# Patient Record
Sex: Male | Born: 1940
Health system: Southern US, Community
[De-identification: ages and names within clinical notes are randomized; demographics above are authoritative.]

## PROBLEM LIST (undated history)

## (undated) DIAGNOSIS — G4730 Sleep apnea, unspecified: Secondary | ICD-10-CM

## (undated) DIAGNOSIS — I10 Essential (primary) hypertension: Secondary | ICD-10-CM

## (undated) DIAGNOSIS — H409 Unspecified glaucoma: Secondary | ICD-10-CM

## (undated) HISTORY — PX: COLONOSCOPY: SHX174

---

## 2003-12-14 ENCOUNTER — Emergency Department (HOSPITAL_COMMUNITY): Admission: AD | Admit: 2003-12-14 | Discharge: 2003-12-14 | Payer: Self-pay | Admitting: Emergency Medicine

## 2007-09-22 ENCOUNTER — Emergency Department (HOSPITAL_COMMUNITY): Admission: EM | Admit: 2007-09-22 | Discharge: 2007-09-22 | Payer: Self-pay | Admitting: Emergency Medicine

## 2012-12-29 ENCOUNTER — Emergency Department (HOSPITAL_COMMUNITY): Payer: Medicare Other

## 2012-12-29 ENCOUNTER — Encounter (HOSPITAL_COMMUNITY): Payer: Self-pay | Admitting: *Deleted

## 2012-12-29 ENCOUNTER — Emergency Department (HOSPITAL_COMMUNITY)
Admission: EM | Admit: 2012-12-29 | Discharge: 2012-12-29 | Disposition: A | Discharge status: Home / Self Care | Payer: Medicare Other | Attending: Emergency Medicine | Admitting: Emergency Medicine

## 2012-12-29 DIAGNOSIS — Z79899 Other long term (current) drug therapy: Secondary | ICD-10-CM | POA: Insufficient documentation

## 2012-12-29 DIAGNOSIS — M6740 Ganglion, unspecified site: Secondary | ICD-10-CM | POA: Insufficient documentation

## 2012-12-29 DIAGNOSIS — I10 Essential (primary) hypertension: Secondary | ICD-10-CM | POA: Insufficient documentation

## 2012-12-29 HISTORY — DX: Essential (primary) hypertension: I10

## 2012-12-29 MED ORDER — TRAMADOL HCL 50 MG PO TABS
50.0000 mg | ORAL_TABLET | Freq: Once | ORAL | Status: AC
  Administered 2012-12-29: 50 mg via ORAL
  Filled 2012-12-29: qty 1

## 2012-12-29 MED ORDER — TRAMADOL HCL 50 MG PO TABS: 50.0000 mg | ORAL_TABLET | Freq: Four times a day (QID) | ORAL | Status: DC | PRN

## 2012-12-29 NOTE — ED Notes (Signed)
Struck rt hand accidentally , has cyst on this hand for years.  Now rt hand is swollen and red.

## 2012-12-29 NOTE — ED Provider Notes (Signed)
History     CSN: [id number]  Arrival date & time 12/29/12  1644   First MD Initiated Contact with Patient 12/29/12 2028      Chief Complaint  Patient presents with  . Hand Pain    (Consider location/radiation/quality/duration/timing/severity/associated sxs/prior treatment) HPI Comments: Tracy Davenport is a 72 y.o. Male with right hand pain, for 4 days, that began, after he lifted a heavy object. This maneuver, caused his  right hand cyst to burst. Since then, he has had pain and swelling of the dorsum of his right hand. He did not experience a contusion. He has pain only with palpation. Not with movement. He denies neck, shoulder, elbow or wrist pain. There's been no fever, chills, nausea, or vomiting. There are no other modifying factors.  Patient is a 72 y.o. male presenting with hand pain. The history is provided by the patient.  Hand Pain    Past Medical History  Diagnosis Date  . Hypertension     History reviewed. No pertinent past surgical history.  History reviewed. No pertinent family history.  History  Substance Use Topics  . Smoking status: Never Smoker   . Smokeless tobacco: Not on file  . Alcohol Use: No      Review of Systems  All other systems reviewed and are negative.    Allergies  Review of patient's allergies indicates no known allergies.  Home Medications   Current Outpatient Rx  Name  Route  Sig  Dispense  Refill  . aspirin 325 MG tablet   Oral   Take 325 mg by mouth daily as needed for pain (TAKES ONLY FOR OCCASIONAL PAIN).         Marland Kitchen bimatoprost (LUMIGAN) 0.01 % SOLN   Ophthalmic   Apply 1 drop to eye at bedtime.         Marland Kitchen doxazosin (CARDURA) 8 MG tablet   Oral   Take 8 mg by mouth at bedtime.         . traMADol (ULTRAM) 50 MG tablet   Oral   Take 1 tablet (50 mg total) by mouth every 6 (six) hours as needed for pain.   30 tablet   0     BP 160/98  Pulse 58  Temp(Src) 97.6 F (36.4 C) (Oral)  Resp 18  SpO2  99%  Physical Exam  Nursing note and vitals reviewed. Constitutional: He is oriented to person, place, and time. He appears well-developed and well-nourished.  HENT:  Head: Normocephalic and atraumatic.  Right Ear: External ear normal.  Left Ear: External ear normal.  Eyes: Conjunctivae and EOM are normal. Pupils are equal, round, and reactive to light.  Neck: Normal range of motion and phonation normal. Neck supple.  Cardiovascular: Normal rate.   Pulmonary/Chest: Effort normal. He exhibits no bony tenderness.  Abdominal: Soft. Normal appearance.  Musculoskeletal: Normal range of motion.  Dorsum of right hand is tender and swollen. There is no associated erythema or fluctuance. There is a small ganglion cyst palpable at the dorsal ace of the second metacarpal. There is no proximal streaking or palpable epitrochlear nodes, at the elbow  Neurological: He is alert and oriented to person, place, and time. He has normal strength. No cranial nerve deficit or sensory deficit. He exhibits normal muscle tone. Coordination normal.  Skin: Skin is warm, dry and intact.  Psychiatric: He has a normal mood and affect. His behavior is normal. Judgment and thought content normal.    ED Course  Procedures (including  critical care time)   Medications  traMADol (ULTRAM) tablet 50 mg (50 mg Oral Given 12/29/12 2058)     Ace wrap applied to right hand      Labs Reviewed - No data to display Dg Hand Complete Right  12/29/2012  *RADIOLOGY REPORT*  Clinical Data: Hand pain swelling, redness  RIGHT HAND - COMPLETE 3+ VIEW  Comparison: None.  Findings: Three views of the right hand submitted.  Soft tissue swelling noted dorsal metacarpal region.  Degenerative changes are noted carpal metacarpal joint.  Degenerative changes interphalangeal joint of the thumb.  Degenerative changes distal interphalangeal joints second third and fifth finger.  IMPRESSION: No acute fracture or subluxation.  Soft tissue swelling  dorsally. Degenerative changes as described above.   Original Report Authenticated By: Natasha Mead, M.D.    Nursing Notes Reviewed/ Care Coordinated, and agree without changes. Applicable Imaging Reviewed Interpretation of Laboratory Data incorporated into ED treatment   1. Ganglion cyst       MDM  The patient has had clinical evidence for rupture of a long-standing ganglion cyst of his right hand. I doubt fracture, infection, tendinitis. He is stable for discharge    Plan: Home Medications- Tramadol; Home Treatments- Ace wrap; Recommended follow up- PCP prn       Flint Melter, MD 12/31/12 [phone]

## 2014-07-16 ENCOUNTER — Emergency Department (HOSPITAL_COMMUNITY): Payer: Medicare HMO

## 2014-07-16 ENCOUNTER — Emergency Department (HOSPITAL_COMMUNITY)
Admission: EM | Admit: 2014-07-16 | Discharge: 2014-07-16 | Disposition: A | Discharge status: Home / Self Care | Payer: Medicare HMO | Attending: Emergency Medicine | Admitting: Emergency Medicine

## 2014-07-16 ENCOUNTER — Encounter (HOSPITAL_COMMUNITY): Payer: Self-pay | Admitting: Emergency Medicine

## 2014-07-16 DIAGNOSIS — N200 Calculus of kidney: Secondary | ICD-10-CM | POA: Diagnosis not present

## 2014-07-16 DIAGNOSIS — Z79899 Other long term (current) drug therapy: Secondary | ICD-10-CM | POA: Insufficient documentation

## 2014-07-16 DIAGNOSIS — R112 Nausea with vomiting, unspecified: Secondary | ICD-10-CM | POA: Insufficient documentation

## 2014-07-16 DIAGNOSIS — Z7982 Long term (current) use of aspirin: Secondary | ICD-10-CM | POA: Diagnosis not present

## 2014-07-16 DIAGNOSIS — R109 Unspecified abdominal pain: Secondary | ICD-10-CM

## 2014-07-16 DIAGNOSIS — I10 Essential (primary) hypertension: Secondary | ICD-10-CM | POA: Diagnosis not present

## 2014-07-16 LAB — URINE MICROSCOPIC-ADD ON

## 2014-07-16 LAB — CBC WITH DIFFERENTIAL/PLATELET
BASOS ABS: 0 10*3/uL (ref 0.0–0.1)
Basophils Relative: 0 % (ref 0–1)
EOS PCT: 3 % (ref 0–5)
Eosinophils Absolute: 0.1 10*3/uL (ref 0.0–0.7)
HCT: 38.4 % — ABNORMAL LOW (ref 39.0–52.0)
HEMOGLOBIN: 13.3 g/dL (ref 13.0–17.0)
LYMPHS ABS: 1.7 10*3/uL (ref 0.7–4.0)
Lymphocytes Relative: 37 % (ref 12–46)
MCH: 29.8 pg (ref 26.0–34.0)
MCHC: 34.6 g/dL (ref 30.0–36.0)
MCV: 86.1 fL (ref 78.0–100.0)
MONOS PCT: 7 % (ref 3–12)
Monocytes Absolute: 0.3 10*3/uL (ref 0.1–1.0)
NEUTROS ABS: 2.4 10*3/uL (ref 1.7–7.7)
Neutrophils Relative %: 53 % (ref 43–77)
Platelets: 170 10*3/uL (ref 150–400)
RBC: 4.46 MIL/uL (ref 4.22–5.81)
RDW: 12.2 % (ref 11.5–15.5)
WBC: 4.6 10*3/uL (ref 4.0–10.5)

## 2014-07-16 LAB — BASIC METABOLIC PANEL
ANION GAP: 10 (ref 5–15)
BUN: 18 mg/dL (ref 6–23)
CO2: 28 mEq/L (ref 19–32)
Calcium: 9.6 mg/dL (ref 8.4–10.5)
Chloride: 104 mEq/L (ref 96–112)
Creatinine, Ser: 1.14 mg/dL (ref 0.50–1.35)
GFR calc non Af Amer: 62 mL/min — ABNORMAL LOW (ref >90)
GFR, EST AFRICAN AMERICAN: 72 mL/min — AB (ref >90)
GLUCOSE: 99 mg/dL (ref 70–99)
Potassium: 3.7 mEq/L (ref 3.7–5.3)
Sodium: 142 mEq/L (ref 137–147)

## 2014-07-16 LAB — URINALYSIS, ROUTINE W REFLEX MICROSCOPIC
BILIRUBIN URINE: NEGATIVE
GLUCOSE, UA: NEGATIVE mg/dL
Ketones, ur: NEGATIVE mg/dL
LEUKOCYTES UA: NEGATIVE
NITRITE: NEGATIVE
PH: 7.5 (ref 5.0–8.0)
PROTEIN: NEGATIVE mg/dL
Specific Gravity, Urine: 1.01 (ref 1.005–1.030)
Urobilinogen, UA: 1 mg/dL (ref 0.0–1.0)

## 2014-07-16 MED ORDER — ONDANSETRON HCL 4 MG PO TABS: 4.0000 mg | ORAL_TABLET | Freq: Three times a day (TID) | ORAL | Status: DC | PRN

## 2014-07-16 MED ORDER — OXYCODONE-ACETAMINOPHEN 5-325 MG PO TABS: 1.0000 | ORAL_TABLET | Freq: Four times a day (QID) | ORAL | Status: DC | PRN

## 2014-07-16 MED ORDER — ONDANSETRON HCL 4 MG/2ML IJ SOLN
4.0000 mg | Freq: Once | INTRAMUSCULAR | Status: AC
  Administered 2014-07-16: 4 mg via INTRAMUSCULAR
  Filled 2014-07-16: qty 2

## 2014-07-16 MED ORDER — SODIUM CHLORIDE 0.9 % IV BOLUS (SEPSIS)
1000.0000 mL | Freq: Once | INTRAVENOUS | Status: AC
  Administered 2014-07-16: 1000 mL via INTRAVENOUS

## 2014-07-16 MED ORDER — MORPHINE SULFATE 4 MG/ML IJ SOLN
4.0000 mg | Freq: Once | INTRAMUSCULAR | Status: AC
  Administered 2014-07-16: 4 mg via INTRAVENOUS
  Filled 2014-07-16: qty 1

## 2014-07-16 MED ORDER — TAMSULOSIN HCL 0.4 MG PO CAPS
0.4000 mg | ORAL_CAPSULE | Freq: Every day | ORAL | Status: DC
Start: 2014-07-16 — End: 2015-10-16

## 2014-07-16 NOTE — ED Notes (Signed)
Pt reports pain and pressure to right flank area that started about an hour ago.

## 2014-07-16 NOTE — ED Provider Notes (Signed)
CSN: [id number]     Arrival date & time 07/16/14  0057 History   First MD Initiated Contact with Patient 07/16/14 0126     Chief Complaint  Patient presents with  . Flank Pain     (Consider location/radiation/quality/duration/timing/severity/associated sxs/prior Treatment) HPI  This is a 73 year old male with history of hypertension who presents with right-sided flank pain. Patient reports onset of symptoms at 10:30 PM. He reports pressure-like pain that is nonradiating. He reports associated vomiting and nausea without diarrhea. Current pain is 7/10. Denies any history of similar symptoms. Denies any hematuria or dysuria. No known history of kidney stones. Patient denies any fevers, chest pain, shortness of breath, abdominal pain.  Past Medical History  Diagnosis Date  . Hypertension    History reviewed. No pertinent past surgical history. No family history on file. History  Substance Use Topics  . Smoking status: Never Smoker   . Smokeless tobacco: Not on file  . Alcohol Use: No    Review of Systems  Constitutional: Negative.  Negative for fever.  Respiratory: Negative.  Negative for chest tightness and shortness of breath.   Cardiovascular: Negative.  Negative for chest pain.  Gastrointestinal: Positive for nausea and vomiting. Negative for abdominal pain and diarrhea.  Genitourinary: Positive for flank pain. Negative for dysuria and hematuria.  Musculoskeletal: Negative for gait problem.  Skin: Negative for rash.  Neurological: Negative for headaches.  All other systems reviewed and are negative.     Allergies  Review of patient's allergies indicates no known allergies.  Home Medications   Prior to Admission medications   Medication Sig Start Date End Date Taking? Authorizing Provider  aspirin 325 MG tablet Take 325 mg by mouth daily as needed for pain (TAKES ONLY FOR OCCASIONAL PAIN).   Yes Historical Provider, MD  bimatoprost (LUMIGAN) 0.01 % SOLN Apply 1 drop  to eye at bedtime.   Yes Historical Provider, MD  doxazosin (CARDURA) 8 MG tablet Take 8 mg by mouth at bedtime.   Yes Historical Provider, MD  traMADol (ULTRAM) 50 MG tablet Take 1 tablet (50 mg total) by mouth every 6 (six) hours as needed for pain. 12/29/12  Yes Richarda Blade, MD  ondansetron (ZOFRAN) 4 MG tablet Take 1 tablet (4 mg total) by mouth every 8 (eight) hours as needed for nausea or vomiting. 07/16/14   Merryl Hacker, MD  oxyCODONE-acetaminophen (PERCOCET/ROXICET) 5-325 MG per tablet Take 1 tablet by mouth every 6 (six) hours as needed for moderate pain or severe pain. 07/16/14   Merryl Hacker, MD  tamsulosin (FLOMAX) 0.4 MG CAPS capsule Take 1 capsule (0.4 mg total) by mouth daily. 07/16/14   Merryl Hacker, MD   BP 193/95  Pulse 62  Temp(Src) 98.1 F (36.7 C) (Oral)  Resp 20  Ht 5\' 10"  (1.778 m)  Wt 181 lb (82.101 kg)  BMI 25.97 kg/m2  SpO2 99% Physical Exam  Nursing note and vitals reviewed. Constitutional: He is oriented to person, place, and time. No distress.  Elderly, uncomfortable appearing  HENT:  Head: Normocephalic and atraumatic.  Mouth/Throat: Oropharynx is clear and moist.  Cardiovascular: Normal rate, regular rhythm and normal heart sounds.   No murmur heard. Pulmonary/Chest: Effort normal and breath sounds normal. No respiratory distress. He has no wheezes.  Abdominal: Soft. Bowel sounds are normal. There is no tenderness. There is no rebound and no guarding.  Genitourinary:  No CVA tenderness  Musculoskeletal: He exhibits no edema.  Lymphadenopathy:    He  has no cervical adenopathy.  Neurological: He is alert and oriented to person, place, and time.  Skin: Skin is warm and dry.  Psychiatric: He has a normal mood and affect.    ED Course  Procedures (including critical care time) Labs Review Labs Reviewed  CBC WITH DIFFERENTIAL - Abnormal; Notable for the following:    HCT 38.4 (*)    All other components within normal limits   BASIC METABOLIC PANEL - Abnormal; Notable for the following:    GFR calc non Af Amer 62 (*)    GFR calc Af Amer 72 (*)    All other components within normal limits  URINALYSIS, ROUTINE W REFLEX MICROSCOPIC - Abnormal; Notable for the following:    APPearance HAZY (*)    Hgb urine dipstick LARGE (*)    All other components within normal limits  URINE MICROSCOPIC-ADD ON    Imaging Review Ct Renal Stone Study  07/16/2014   CLINICAL DATA:  Constant pain and pressure right flank x2 hr. Initial evaluation.  EXAM: CT ABDOMEN AND PELVIS WITHOUT CONTRAST  TECHNIQUE: Multidetector CT imaging of the abdomen and pelvis was performed following the standard protocol without IV contrast.  COMPARISON:  No prior.  FINDINGS: Liver normal. Spleen normal. Pancreas normal. No biliary distention. Gallbladder nondistended.  Adrenals are normal. Simple cyst left kidney. 4 mm stone noted at the distal right ureter with mild right hydronephrosis and hydroureter. Right periureteral streaking is present. Bladder is nondistended. The prostate is severely enlarged measuring 6.7 x 5.6 x 8.4 cm. Prostate contour is irregular. Seminal vesicles are mildly prominent. BPH and/or prostate malignancy could present in this fashion.  No significant inguinal adenopathy is noted. No significant retroperitoneal lymph nodes are noted. Aorta normal caliber.  Appendix normal. No bowel distention. No free air. No mesenteric mass. No significant abdominal wall hernia.  Heart size normal. Mild basilar pleural thickening, possibly scarring noted. Multiple sclerotic densities are noted throughout lumbar spine and the pelvis. Blastic metastatic disease could present in this fashion, possibly from prostate cancer. Whole-body bone scan suggested to further evaluate.  IMPRESSION: 1. 4 mm stone distal right ureter with mild right hydronephrosis and hydroureter. 2. Severely enlarged irregular prostate. BPH and/or prostate malignancy could present in this  fashion. 3. Multiple sclerotic densities noted throughout the lumbar spine and pelvis suggesting the possibility of metastatic disease. Whole-body bone scan suggested to further evaluate.   Electronically Signed   By: Marcello Moores  Register   On: 07/16/2014 02:31     EKG Interpretation None      MDM   Final diagnoses:  Flank pain  Kidney stone    Patient presents with onset of left flank pain.  It is uncomfortable appearing but nontoxic on exam. Pain is not reproducible on exam. History suggestive of possible kidney stones. Patient given fluids, pain medication, and nausea medication.  Basic labwork obtained. Lab work notable for too numerous to count red cells in the urine. CT stone study with 4 mm distal right ureter stone with mild hydronephrosis. Incidentally, patient to have a severely enlarged prostate and multiple sclerotic densities concerning for possible malignancy. On recheck, patient reports improvement of his symptoms his pain medication. Discussed with patient and his wife supportive care and expectant management for his kidney stones. Patient does report a history of enlarged prostate. I discussed with him the results of the CT scan. He has a primary care appointment on Friday. He will be given his CT results and further evaluation will be deferred  to his primary care physician.  Patient will also be referred to urology. He was placed on Flomax and we given pain medication. He was given strict return precautions.  After history, exam, and medical workup I feel the patient has been appropriately medically screened and is safe for discharge home. Pertinent diagnoses were discussed with the patient. Patient was given return precautions.     Merryl Hacker, MD 07/16/14 [phone]

## 2014-07-16 NOTE — Discharge Instructions (Signed)
You were seen today for flank pain and found to have kidney stones. Your CT scan also shows an enlarged prostate which may need further evaluation for possible metastatic disease. Regarding kidney stones, you should aggressively hydrate.   Your stone is 4 mm and will likely pass on its own. If you continue to have further pain, UA given followup with urology. He would be given pain and nausea medication.   Kidney Stones Kidney stones (urolithiasis) are deposits that form inside your kidneys. The intense pain is caused by the stone moving through the urinary tract. When the stone moves, the ureter goes into spasm around the stone. The stone is usually passed in the urine.  CAUSES   A disorder that makes certain neck glands produce too much parathyroid hormone (primary hyperparathyroidism).  A buildup of uric acid crystals, similar to gout in your joints.  Narrowing (stricture) of the ureter.  A kidney obstruction present at birth (congenital obstruction).  Previous surgery on the kidney or ureters.  Numerous kidney infections. SYMPTOMS   Feeling sick to your stomach (nauseous).  Throwing up (vomiting).  Blood in the urine (hematuria).  Pain that usually spreads (radiates) to the groin.  Frequency or urgency of urination. DIAGNOSIS   Taking a history and physical exam.  Blood or urine tests.  CT scan.  Occasionally, an examination of the inside of the urinary bladder (cystoscopy) is performed. TREATMENT   Observation.  Increasing your fluid intake.  Extracorporeal shock wave lithotripsy--This is a noninvasive procedure that uses shock waves to break up kidney stones.  Surgery may be needed if you have severe pain or persistent obstruction. There are various surgical procedures. Most of the procedures are performed with the use of small instruments. Only small incisions are needed to accommodate these instruments, so recovery time is minimized. The size, location, and  chemical composition are all important variables that will determine the proper choice of action for you. Talk to your health care provider to better understand your situation so that you will minimize the risk of injury to yourself and your kidney.  HOME CARE INSTRUCTIONS   Drink enough water and fluids to keep your urine clear or pale yellow. This will help you to pass the stone or stone fragments.  Strain all urine through the provided strainer. Keep all particulate matter and stones for your health care provider to see. The stone causing the pain may be as small as a grain of salt. It is very important to use the strainer each and every time you pass your urine. The collection of your stone will allow your health care provider to analyze it and verify that a stone has actually passed. The stone analysis will often identify what you can do to reduce the incidence of recurrences.  Only take over-the-counter or prescription medicines for pain, discomfort, or fever as directed by your health care provider.  Make a follow-up appointment with your health care provider as directed.  Get follow-up X-rays if required. The absence of pain does not always mean that the stone has passed. It may have only stopped moving. If the urine remains completely obstructed, it can cause loss of kidney function or even complete destruction of the kidney. It is your responsibility to make sure X-rays and follow-ups are completed. Ultrasounds of the kidney can show blockages and the status of the kidney. Ultrasounds are not associated with any radiation and can be performed easily in a matter of minutes. SEEK MEDICAL CARE IF:  You experience pain that is progressive and unresponsive to any pain medicine you have been prescribed. SEEK IMMEDIATE MEDICAL CARE IF:   Pain cannot be controlled with the prescribed medicine.  You have a fever or shaking chills.  The severity or intensity of pain increases over 18 hours and  is not relieved by pain medicine.  You develop a new onset of abdominal pain.  You feel faint or pass out.  You are unable to urinate. MAKE SURE YOU:   Understand these instructions.  Will watch your condition.  Will get help right away if you are not doing well or get worse. Document Released: 09/23/2005 Document Revised: 05/26/2013 Document Reviewed: 02/24/2013 [facility] Patient Information 2015 [place], [place]. This information is not intended to replace advice given to you by your health care provider. Make sure you discuss any questions you have with your health care provider.

## 2014-11-17 DIAGNOSIS — H26493 Other secondary cataract, bilateral: Secondary | ICD-10-CM | POA: Diagnosis not present

## 2014-11-17 DIAGNOSIS — H4011X3 Primary open-angle glaucoma, severe stage: Secondary | ICD-10-CM | POA: Diagnosis not present

## 2014-12-15 DIAGNOSIS — I10 Essential (primary) hypertension: Secondary | ICD-10-CM | POA: Diagnosis not present

## 2014-12-15 DIAGNOSIS — N529 Male erectile dysfunction, unspecified: Secondary | ICD-10-CM | POA: Diagnosis not present

## 2014-12-16 DIAGNOSIS — I10 Essential (primary) hypertension: Secondary | ICD-10-CM | POA: Diagnosis not present

## 2014-12-16 DIAGNOSIS — H4011X2 Primary open-angle glaucoma, moderate stage: Secondary | ICD-10-CM | POA: Diagnosis not present

## 2014-12-16 DIAGNOSIS — Z961 Presence of intraocular lens: Secondary | ICD-10-CM | POA: Diagnosis not present

## 2015-01-27 DIAGNOSIS — I10 Essential (primary) hypertension: Secondary | ICD-10-CM | POA: Diagnosis not present

## 2015-03-13 DIAGNOSIS — I10 Essential (primary) hypertension: Secondary | ICD-10-CM | POA: Diagnosis not present

## 2015-03-28 DIAGNOSIS — H26493 Other secondary cataract, bilateral: Secondary | ICD-10-CM | POA: Diagnosis not present

## 2015-03-28 DIAGNOSIS — H4011X2 Primary open-angle glaucoma, moderate stage: Secondary | ICD-10-CM | POA: Diagnosis not present

## 2015-06-20 DIAGNOSIS — Z23 Encounter for immunization: Secondary | ICD-10-CM | POA: Diagnosis not present

## 2015-06-20 DIAGNOSIS — H409 Unspecified glaucoma: Secondary | ICD-10-CM | POA: Diagnosis not present

## 2015-06-20 DIAGNOSIS — I10 Essential (primary) hypertension: Secondary | ICD-10-CM | POA: Diagnosis not present

## 2015-08-01 DIAGNOSIS — H26493 Other secondary cataract, bilateral: Secondary | ICD-10-CM | POA: Diagnosis not present

## 2015-08-01 DIAGNOSIS — H401132 Primary open-angle glaucoma, bilateral, moderate stage: Secondary | ICD-10-CM | POA: Diagnosis not present

## 2015-09-19 DIAGNOSIS — R739 Hyperglycemia, unspecified: Secondary | ICD-10-CM | POA: Diagnosis not present

## 2015-09-19 DIAGNOSIS — I10 Essential (primary) hypertension: Secondary | ICD-10-CM | POA: Diagnosis not present

## 2015-09-19 DIAGNOSIS — Z23 Encounter for immunization: Secondary | ICD-10-CM | POA: Diagnosis not present

## 2015-09-20 ENCOUNTER — Telehealth: Payer: Self-pay

## 2015-09-20 NOTE — Telephone Encounter (Signed)
[phone]   PATIENT RECEIVED LETTER TO SCHEDULE TCS

## 2015-09-21 ENCOUNTER — Telehealth: Payer: Self-pay

## 2015-09-21 NOTE — Telephone Encounter (Signed)
Gastroenterology Pre-Procedure Review  Request Date: 09/21/2015 Requesting Physician: Dr. Legrand Rams  PATIENT REVIEW QUESTIONS: The patient responded to the following health history questions as indicated:    Pt said he had his first colonoscopy about 9-10 years ago at [facility]  1. Diabetes Melitis: no 2. Joint replacements in the past 12 months: no 3. Major health problems in the past 3 months: no 4. Has an artificial valve or MVP: no 5. Has a defibrillator: no 6. Has been advised in past to take antibiotics in advance of a procedure like teeth cleaning: no 7. Family history of colon cancer: no  8. Alcohol Use: Occasionally     He will drink a can of beer every day for week or so and then go months without any 9. History of sleep apnea: no     MEDICATIONS & ALLERGIES:    Patient reports the following regarding taking any blood thinners:   Plavix? no Aspirin? no Coumadin? no  Patient confirms/reports the following medications:  Current Outpatient Prescriptions  Medication Sig Dispense Refill  . aspirin 325 MG tablet Take 325 mg by mouth daily as needed for pain (TAKES ONLY FOR OCCASIONAL PAIN).    Marland Kitchen bimatoprost (LUMIGAN) 0.01 % SOLN Apply 1 drop to eye at bedtime.    Marland Kitchen doxazosin (CARDURA) 8 MG tablet Take 8 mg by mouth at bedtime.    Marland Kitchen losartan (COZAAR) 100 MG tablet Take 100 mg by mouth daily.    . ondansetron (ZOFRAN) 4 MG tablet Take 1 tablet (4 mg total) by mouth every 8 (eight) hours as needed for nausea or vomiting. 15 tablet 0  . oxyCODONE-acetaminophen (PERCOCET/ROXICET) 5-325 MG per tablet Take 1 tablet by mouth every 6 (six) hours as needed for moderate pain or severe pain. (Patient not taking: Reported on 09/21/2015) 15 tablet 0  . tamsulosin (FLOMAX) 0.4 MG CAPS capsule Take 1 capsule (0.4 mg total) by mouth daily. (Patient not taking: Reported on 09/21/2015) 15 capsule 0  . traMADol (ULTRAM) 50 MG tablet Take 1 tablet (50 mg total) by mouth every 6 (six) hours as needed for  pain. (Patient not taking: Reported on 09/21/2015) 30 tablet 0   No current facility-administered medications for this visit.    Patient confirms/reports the following allergies:  No Known Allergies  No orders of the defined types were placed in this encounter.    AUTHORIZATION INFORMATION Primary Insurance:   ID #:   Group #:  Pre-Cert / Auth #:   Secondary Insurance:   ID #:   Group #:  Pre-Cert / Auth required:  Pre-Cert / Auth #:   SCHEDULE INFORMATION: Procedure has been scheduled as follows:  Date:                Time:   Location:   This Gastroenterology Pre-Precedure Review Form is being routed to the following provider(s): Barney Drain, MD

## 2015-09-21 NOTE — Telephone Encounter (Signed)
LM for pt to call

## 2015-09-21 NOTE — Telephone Encounter (Signed)
See separate triage.  

## 2015-09-25 NOTE — Telephone Encounter (Signed)
SUPREP SPLIT DOSING- CLEAR LIQUIDS WITH BREAKFAST.  

## 2015-09-26 ENCOUNTER — Other Ambulatory Visit: Payer: Self-pay

## 2015-09-26 DIAGNOSIS — Z1211 Encounter for screening for malignant neoplasm of colon: Secondary | ICD-10-CM

## 2015-09-26 MED ORDER — NA SULFATE-K SULFATE-MG SULF 17.5-3.13-1.6 GM/177ML PO SOLN: 1.0000 | ORAL | Status: DC

## 2015-09-26 NOTE — Telephone Encounter (Signed)
Rx sent to the pharmacy and instructions mailed to pt.  

## 2015-10-03 ENCOUNTER — Telehealth: Payer: Self-pay

## 2015-10-03 NOTE — Telephone Encounter (Signed)
I went online with [facility] and the colonoscopy was approved , Ref # [id number].

## 2015-10-16 ENCOUNTER — Ambulatory Visit (HOSPITAL_COMMUNITY)
Admission: RE | Admit: 2015-10-16 | Discharge: 2015-10-16 | Disposition: A | Discharge status: Home / Self Care | Payer: Medicare HMO | Source: Ambulatory Visit | Attending: Gastroenterology | Admitting: Gastroenterology

## 2015-10-16 ENCOUNTER — Encounter (HOSPITAL_COMMUNITY): Payer: Self-pay | Admitting: *Deleted

## 2015-10-16 ENCOUNTER — Encounter (HOSPITAL_COMMUNITY)
Admission: RE | Disposition: A | Discharge status: Home / Self Care | Payer: Self-pay | Source: Ambulatory Visit | Attending: Gastroenterology

## 2015-10-16 DIAGNOSIS — K648 Other hemorrhoids: Secondary | ICD-10-CM | POA: Diagnosis not present

## 2015-10-16 DIAGNOSIS — Z1211 Encounter for screening for malignant neoplasm of colon: Secondary | ICD-10-CM | POA: Insufficient documentation

## 2015-10-16 DIAGNOSIS — D122 Benign neoplasm of ascending colon: Secondary | ICD-10-CM | POA: Diagnosis not present

## 2015-10-16 DIAGNOSIS — Z79899 Other long term (current) drug therapy: Secondary | ICD-10-CM | POA: Diagnosis not present

## 2015-10-16 DIAGNOSIS — Q438 Other specified congenital malformations of intestine: Secondary | ICD-10-CM | POA: Diagnosis not present

## 2015-10-16 DIAGNOSIS — D123 Benign neoplasm of transverse colon: Secondary | ICD-10-CM

## 2015-10-16 DIAGNOSIS — K5730 Diverticulosis of large intestine without perforation or abscess without bleeding: Secondary | ICD-10-CM | POA: Diagnosis not present

## 2015-10-16 DIAGNOSIS — Z7982 Long term (current) use of aspirin: Secondary | ICD-10-CM | POA: Diagnosis not present

## 2015-10-16 DIAGNOSIS — I10 Essential (primary) hypertension: Secondary | ICD-10-CM | POA: Insufficient documentation

## 2015-10-16 HISTORY — PX: COLONOSCOPY: SHX5424

## 2015-10-16 SURGERY — COLONOSCOPY
Anesthesia: Moderate Sedation

## 2015-10-16 MED ORDER — MEPERIDINE HCL 100 MG/ML IJ SOLN
INTRAMUSCULAR | Status: AC
  Filled 2015-10-16: qty 2

## 2015-10-16 MED ORDER — SPOT INK MARKER SYRINGE KIT
PACK | SUBMUCOSAL | Status: DC | PRN
  Administered 2015-10-16: 1 mL via SUBMUCOSAL

## 2015-10-16 MED ORDER — MEPERIDINE HCL 100 MG/ML IJ SOLN
INTRAMUSCULAR | Status: DC | PRN
  Administered 2015-10-16 (×2): 25 mg via INTRAVENOUS

## 2015-10-16 MED ORDER — SODIUM CHLORIDE 0.9 % IV SOLN
INTRAVENOUS | Status: DC
  Administered 2015-10-16: 09:00:00 via INTRAVENOUS

## 2015-10-16 MED ORDER — MIDAZOLAM HCL 5 MG/5ML IJ SOLN
INTRAMUSCULAR | Status: DC | PRN
  Administered 2015-10-16 (×2): 2 mg via INTRAVENOUS
  Administered 2015-10-16: 1 mg via INTRAVENOUS

## 2015-10-16 MED ORDER — STERILE WATER FOR IRRIGATION IR SOLN
Status: DC | PRN
  Administered 2015-10-16: 10:00:00

## 2015-10-16 MED ORDER — MIDAZOLAM HCL 5 MG/5ML IJ SOLN
INTRAMUSCULAR | Status: AC
  Filled 2015-10-16: qty 10

## 2015-10-16 NOTE — Op Note (Signed)
[facility] [address] [place], [address]   COLONOSCOPY PROCEDURE REPORT  PATIENT: Tracy, Davenport  MR#: [id number] BIRTHDATE: December 13, 1940 , 74  yrs. old GENDER: male ENDOSCOPIST: Danie Binder, MD REFERRED [id number] Fanta, M.D. PROCEDURE DATE:  10-27-15 PROCEDURE:   Colonoscopy with cold biopsy AND snare polypectomy, and Submucosal injection(SPOT 1 CC) INDICATIONS:average risk patient for colon cancer. MEDICATIONS: Demerol 50 mg IV and Versed 5 mg IV  DESCRIPTION OF PROCEDURE:    Physical exam was performed.  Informed consent was obtained from the patient after explaining the benefits, risks, and alternatives to procedure.  The patient was connected to monitor and placed in left lateral position. Continuous oxygen was provided by nasal cannula and IV medicine administered through an indwelling cannula.  After administration of sedation and rectal exam, the patients rectum was intubated and the [id number]  colonoscope was advanced under direct visualization to the cecum.  The scope was removed slowly by carefully examining the color, texture, anatomy, and integrity mucosa on the way out.  The patient was recovered in endoscopy and discharged home in satisfactory condition. Estimated blood loss is zero unless otherwise noted in this procedure report.    COLON FINDINGS: Three sessile polyps ranging from 6 to 73mm in size were found in the transverse colon(2)and ascending colon(1.2 CM). A polypectomy was performed using snare cautery.  1 CC SPOT tattoo was applied IN ASCENDING COLON  The AC wound was closed by placing hemoclips(1).Three sessile polyps ranging from 2 to 18mm in size were found at the splenic flexure, hepatic flexure, and in the transverse colon.  A polypectomy was performed with cold forceps.There was mild diverticulosis noted in the descending colon and sigmoid colon with associated tortuosity and muscular hypertrophy. The  colon was redundant.  Manual abdominal counter-pressure was used to reach the cecum, and Small internal hemorrhoids were found.  PREP QUALITY: excellent.  CECAL W/D TIME: 75       minutes COMPLICATIONS: None  ENDOSCOPIC IMPRESSION: 1.   SIX COLON polyps REMOVED 2.  Mild diverticulosis noted in the descending colon and sigmoid colon 4.   The LEFT colon IS was redundant 5.   Small internal hemorrhoids  RECOMMENDATIONS: NO MRI FOR 30 DAYS DUE TO METAL CLIP PLACEMENT IN THE COLON. FOLLOW A HIGH FIBER DIET. AWAIT BIOPSY RESULTS. Next colonoscopy in 1 year DUE TO SIZE OF RIGHT AND LEFT COLON POLYPS.      _______________________________ Lorrin MaisDanie Binder, MD 2015-10-27 2:27 PM    CPT CODES: ICD CODES:  The ICD and CPT codes recommended by this software are interpretations from the data that the clinical staff has captured with the software.  The verification of the translation of this report to the ICD and CPT codes and modifiers is the sole responsibility of the health care institution and practicing physician where this report was generated.  [facility] will not be held responsible for the validity of the ICD and CPT codes included on this report.  AMA assumes no liability for data contained or not contained herein. CPT is a Designer, television/film set of the [employer].  PATIENT NAME:  Tracy, Davenport MR#: [id number]

## 2015-10-16 NOTE — Discharge Instructions (Signed)
You had 6 polyps removed. You have internal hemorrhoids and diverticulosis IN YOUR  LEFT COLON. I PLACED A CLIP IN YOUR RIGHT COLON TO PREVENT BLEEDING IN 7-10 DAYS.    NO MRI FOR 30 DAYS DUE TO METAL CLIP PLACEMENT IN THE COLON.  FOLLOW A HIGH FIBER DIET. AVOID ITEMS THAT CAUSE BLOATING. SEE INFO BELOW.  YOUR BIOPSY RESULTS WILL BE AVAILABLE IN MY CHART JAN 12 AND MY OFFICE WILL CONTACT YOU IN 10-14 DAYS WITH YOUR RESULTS.   Next colonoscopy in 1 year.   During office hours please call [phone] if any problems.  After hours or weekends, please call Main Number at [facility] [phone] if any problems.      Colonoscopy Care After Read the instructions outlined below and refer to this sheet in the next week. These discharge instructions provide you with general information on caring for yourself after you leave the hospital. While your treatment has been planned according to the most current medical practices available, unavoidable complications occasionally occur. If you have any problems or questions after discharge, call DR. Saveon Plant, [phone].  ACTIVITY  You may resume your regular activity, but move at a slower pace for the next 24 hours.   Take frequent rest periods for the next 24 hours.   Walking will help get rid of the air and reduce the bloated feeling in your belly (abdomen).   No driving for 24 hours (because of the medicine (anesthesia) used during the test).   You may shower.   Do not sign any important legal documents or operate any machinery for 24 hours (because of the anesthesia used during the test).    NUTRITION  Drink plenty of fluids.   You may resume your normal diet as instructed by your doctor.   Begin with a light meal and progress to your normal diet. Heavy or fried foods are harder to digest and may make you feel sick to your stomach (nauseated).   Avoid alcoholic beverages for 24 hours or as instructed.    MEDICATIONS  You may  resume your normal medications.   WHAT YOU CAN EXPECT TODAY  Some feelings of bloating in the abdomen.   Passage of more gas than usual.   Spotting of blood in your stool or on the toilet paper  .  IF YOU HAD POLYPS REMOVED DURING THE COLONOSCOPY:  Eat a soft diet IF YOU HAVE NAUSEA, BLOATING, ABDOMINAL PAIN, OR VOMITING.    FINDING OUT THE RESULTS OF YOUR TEST Not all test results are available during your visit. DR. Oneida Alar WILL CALL YOU WITHIN 14 DAYS OF YOUR PROCEDUE WITH YOUR RESULTS. Do not assume everything is normal if you have not heard from DR. Kesley Gaffey, CALL HER OFFICE AT [phone].  SEEK IMMEDIATE MEDICAL ATTENTION AND CALL THE OFFICE: [phone] IF:  You have more than a spotting of blood in your stool.   Your belly is swollen (abdominal distention).   You are nauseated or vomiting.   You have a temperature over 101F.   You have abdominal pain or discomfort that is severe or gets worse throughout the day.  Polyps, Colon  A polyp is extra tissue that grows inside your body. Colon polyps grow in the large intestine. The large intestine, also called the colon, is part of your digestive system. It is a long, hollow tube at the end of your digestive tract where your body makes and stores stool. Most polyps are not dangerous. They are benign. This means  they are not cancerous. But over time, some types of polyps can turn into cancer. Polyps that are smaller than a pea are usually not harmful. But larger polyps could someday become or may already be cancerous. To be safe, doctors remove all polyps and test them.   PREVENTION There is not one sure way to prevent polyps. You might be able to lower your risk of getting them if you:  Eat more fruits and vegetables and less fatty food.   Do not smoke.   Avoid alcohol.   Exercise every day.   Lose weight if you are overweight.   Eating more calcium and folate can also lower your risk of getting polyps. Some foods  that are rich in calcium are milk, cheese, and broccoli. Some foods that are rich in folate are chickpeas, kidney beans, and spinach.   High-Fiber Diet A high-fiber diet changes your normal diet to include more whole grains, legumes, fruits, and vegetables. Changes in the diet involve replacing refined carbohydrates with unrefined foods. The calorie level of the diet is essentially unchanged. The Dietary Reference Intake (recommended amount) for adult males is 38 grams per day. For adult females, it is 25 grams per day. Pregnant and lactating women should consume 28 grams of fiber per day. Fiber is the intact part of a plant that is not broken down during digestion. Functional fiber is fiber that has been isolated from the plant to provide a beneficial effect in the body. PURPOSE  Increase stool bulk.   Ease and regulate bowel movements.   Lower cholesterol.  REDUCE RISK OF COLON CANCER  INDICATIONS THAT YOU NEED MORE FIBER  Constipation and hemorrhoids.   Uncomplicated diverticulosis (intestine condition) and irritable bowel syndrome.   Weight management.   As a protective measure against hardening of the arteries (atherosclerosis), diabetes, and cancer.   GUIDELINES FOR INCREASING FIBER IN THE DIET  Start adding fiber to the diet slowly. A gradual increase of about 5 more grams (2 slices of whole-wheat bread, 2 servings of most fruits or vegetables, or 1 bowl of high-fiber cereal) per day is best. Too rapid an increase in fiber may result in constipation, flatulence, and bloating.   Drink enough water and fluids to keep your urine clear or pale yellow. Water, juice, or caffeine-free drinks are recommended. Not drinking enough fluid may cause constipation.   Eat a variety of high-fiber foods rather than one type of fiber.   Try to increase your intake of fiber through using high-fiber foods rather than fiber pills or supplements that contain small amounts of fiber.   The goal is to  change the types of food eaten. Do not supplement your present diet with high-fiber foods, but replace foods in your present diet.   INCLUDE A VARIETY OF FIBER SOURCES  Replace refined and processed grains with whole grains, canned fruits with fresh fruits, and incorporate other fiber sources. White rice, white breads, and most bakery goods contain little or no fiber.   Brown whole-grain rice, buckwheat oats, and many fruits and vegetables are all good sources of fiber. These include: broccoli, Brussels sprouts, cabbage, cauliflower, beets, sweet potatoes, white potatoes (skin on), carrots, tomatoes, eggplant, squash, berries, fresh fruits, and dried fruits.   Cereals appear to be the richest source of fiber. Cereal fiber is found in whole grains and bran. Bran is the fiber-rich outer coat of cereal grain, which is largely removed in refining. In whole-grain cereals, the bran remains. In breakfast cereals, the  largest amount of fiber is found in those with "bran" in their names. The fiber content is sometimes indicated on the label.   You may need to include additional fruits and vegetables each day.   In baking, for 1 cup white flour, you may use the following substitutions:   1 cup whole-wheat flour minus 2 tablespoons.   1/2 cup white flour plus 1/2 cup whole-wheat flour.   Diverticulosis Diverticulosis is a common condition that develops when small pouches (diverticula) form in the wall of the colon. The risk of diverticulosis increases with age. It happens more often in people who eat a low-fiber diet. Most individuals with diverticulosis have no symptoms. Those individuals with symptoms usually experience belly (abdominal) pain, constipation, or loose stools (diarrhea).  HOME CARE INSTRUCTIONS  Increase the amount of fiber in your diet as directed by your caregiver or dietician. This may reduce symptoms of diverticulosis.   Drink at least 6 to 8 glasses of water each day to prevent  constipation.   Try not to strain when you have a bowel movement.   Avoiding nuts and seeds to prevent complications is NOT NECESSARY.    FOODS HAVING HIGH FIBER CONTENT INCLUDE:  Fruits. Apple, peach, pear, tangerine, raisins, prunes.   Vegetables. Brussels sprouts, asparagus, broccoli, cabbage, carrot, cauliflower, romaine lettuce, spinach, summer squash, tomato, winter squash, zucchini.   Starchy Vegetables. Baked beans, kidney beans, lima beans, split peas, lentils, potatoes (with skin).   Grains. Whole wheat bread, brown rice, bran flake cereal, plain oatmeal, white rice, shredded wheat, bran muffins.   SEEK IMMEDIATE MEDICAL CARE IF:  You develop increasing pain or severe bloating.   You have an oral temperature above 101F.   You develop vomiting or bowel movements that are bloody or black.   Hemorrhoids Hemorrhoids are dilated (enlarged) veins around the rectum. Sometimes clots will form in the veins. This makes them swollen and painful. These are called thrombosed hemorrhoids. Causes of hemorrhoids include:  Constipation.   Straining to have a bowel movement.   HEAVY LIFTING  HOME CARE INSTRUCTIONS  Eat a well balanced diet and drink 6 to 8 glasses of water every day to avoid constipation. You may also use a bulk laxative.   Avoid straining to have bowel movements.   Keep anal area dry and clean.   Do not use a donut shaped pillow or sit on the toilet for long periods. This increases blood pooling and pain.   Move your bowels when your body has the urge; this will require less straining and will decrease pain and pressure.

## 2015-10-16 NOTE — H&P (Signed)
  Primary Care Physician:  Rosita Fire, MD Primary Gastroenterologist:  Dr. Oneida Alar  Pre-Procedure History & Physical: HPI:  Tracy Davenport is a 75 y.o. male here for [place].  Past Medical History  Diagnosis Date  . Hypertension     Past Surgical History  Procedure Laterality Date  . Colonoscopy      Prior to Admission medications   Medication Sig Start Date End Date Taking? Authorizing Provider  aspirin 325 MG tablet Take 325 mg by mouth daily as needed for pain (TAKES ONLY FOR OCCASIONAL PAIN).   Yes Historical Provider, MD  bimatoprost (LUMIGAN) 0.01 % SOLN Apply 1 drop to eye at bedtime.   Yes Historical Provider, MD  COMBIGAN 0.2-0.5 % ophthalmic solution Place 1 drop into both eyes 2 (two) times daily. 09/07/15  Yes Historical Provider, MD  doxazosin (CARDURA) 8 MG tablet Take 8 mg by mouth at bedtime.   Yes Historical Provider, MD  losartan-hydrochlorothiazide (HYZAAR) 100-12.5 MG tablet Take 1 tablet by mouth daily. 07/31/15  Yes Historical Provider, MD  Na Sulfate-K Sulfate-Mg Sulf (SUPREP BOWEL PREP) SOLN Take 1 kit by mouth as directed. 09/26/15  Yes Danie Binder, MD  ondansetron (ZOFRAN) 4 MG tablet Take 1 tablet (4 mg total) by mouth every 8 (eight) hours as needed for nausea or vomiting. 07/16/14   Merryl Hacker, MD  oxyCODONE-acetaminophen (PERCOCET/ROXICET) 5-325 MG per tablet Take 1 tablet by mouth every 6 (six) hours as needed for moderate pain or severe pain. Patient not taking: Reported on 09/21/2015 07/16/14   Merryl Hacker, MD  tamsulosin (FLOMAX) 0.4 MG CAPS capsule Take 1 capsule (0.4 mg total) by mouth daily. Patient not taking: Reported on 09/21/2015 07/16/14   Merryl Hacker, MD  traMADol (ULTRAM) 50 MG tablet Take 1 tablet (50 mg total) by mouth every 6 (six) hours as needed for pain. Patient not taking: Reported on 09/21/2015 12/29/12   Daleen Bo, MD    Allergies as of 09/26/2015  . (No Known Allergies)    History  reviewed. No pertinent family history.  Social History   Social History  . Marital Status: Married    Spouse Name: N/A  . Number of Children: N/A  . Years of Education: N/A   Occupational History  . Not on file.   Social History Main Topics  . Smoking status: Never Smoker   . Smokeless tobacco: Not on file  . Alcohol Use: No  . Drug Use: No  . Sexual Activity: Not on file   Other Topics Concern  . Not on file   Social History Narrative    Review of Systems: See HPI, otherwise negative ROS   Physical Exam: BP 133/77 mmHg  Pulse 56  Temp(Src) 97.5 F (36.4 C) (Oral)  Resp 12  Ht _0  (1.778 m)  Wt 181 lb (82.101 kg)  BMI 25.97 kg/m2  SpO2 99% General:   Alert,  pleasant and cooperative in NAD Head:  Normocephalic and atraumatic. Neck:  Supple; Lungs:  Clear throughout to auscultation.    Heart:  Regular rate and rhythm. Abdomen:  Soft, nontender and nondistended. Normal bowel sounds, without guarding, and without rebound.   Neurologic:  Alert and  oriented x4;  grossly normal neurologically.  Impression/Plan:    COLON CANCER SCREENING.  PLAN 2.COLONOSCOPY JAN 9

## 2015-10-26 ENCOUNTER — Telehealth: Payer: Self-pay | Admitting: Gastroenterology

## 2015-10-26 NOTE — Telephone Encounter (Signed)
Tried to call with no answer  

## 2015-10-26 NOTE — Telephone Encounter (Signed)
Reminder in epic °

## 2015-10-26 NOTE — Telephone Encounter (Signed)
Please call pt. HE had SIX simple adenomas removed.   NO MRI UNTIL FEB 10 DUE TO METAL CLIP PLACEMENT IN THE COLON.  FOLLOW A HIGH FIBER DIET. AVOID ITEMS THAT CAUSE BLOATING.   Next colonoscopy in 1 year DUE TO SIZE AND NUMBER OF ADENOMAS.

## 2015-10-27 NOTE — Telephone Encounter (Signed)
Pt is aware of results. 

## 2015-10-30 NOTE — Telephone Encounter (Signed)
Noted  

## 2015-11-02 ENCOUNTER — Encounter (HOSPITAL_COMMUNITY): Payer: Self-pay | Admitting: Gastroenterology

## 2015-12-05 DIAGNOSIS — H401132 Primary open-angle glaucoma, bilateral, moderate stage: Secondary | ICD-10-CM | POA: Diagnosis not present

## 2015-12-05 DIAGNOSIS — Z961 Presence of intraocular lens: Secondary | ICD-10-CM | POA: Diagnosis not present

## 2015-12-05 DIAGNOSIS — H26493 Other secondary cataract, bilateral: Secondary | ICD-10-CM | POA: Diagnosis not present

## 2015-12-26 DIAGNOSIS — I10 Essential (primary) hypertension: Secondary | ICD-10-CM | POA: Diagnosis not present

## 2016-04-02 DIAGNOSIS — I10 Essential (primary) hypertension: Secondary | ICD-10-CM | POA: Diagnosis not present

## 2016-04-02 DIAGNOSIS — Z961 Presence of intraocular lens: Secondary | ICD-10-CM | POA: Diagnosis not present

## 2016-04-02 DIAGNOSIS — H26493 Other secondary cataract, bilateral: Secondary | ICD-10-CM | POA: Diagnosis not present

## 2016-04-02 DIAGNOSIS — H401132 Primary open-angle glaucoma, bilateral, moderate stage: Secondary | ICD-10-CM | POA: Diagnosis not present

## 2016-07-04 DIAGNOSIS — I10 Essential (primary) hypertension: Secondary | ICD-10-CM | POA: Diagnosis not present

## 2016-07-04 DIAGNOSIS — Z0000 Encounter for general adult medical examination without abnormal findings: Secondary | ICD-10-CM | POA: Diagnosis not present

## 2016-07-04 DIAGNOSIS — R739 Hyperglycemia, unspecified: Secondary | ICD-10-CM | POA: Diagnosis not present

## 2016-07-04 DIAGNOSIS — Z23 Encounter for immunization: Secondary | ICD-10-CM | POA: Diagnosis not present

## 2016-07-04 DIAGNOSIS — K635 Polyp of colon: Secondary | ICD-10-CM | POA: Diagnosis not present

## 2016-08-13 DIAGNOSIS — Z961 Presence of intraocular lens: Secondary | ICD-10-CM | POA: Diagnosis not present

## 2016-08-13 DIAGNOSIS — H401132 Primary open-angle glaucoma, bilateral, moderate stage: Secondary | ICD-10-CM | POA: Diagnosis not present

## 2016-08-13 DIAGNOSIS — H26493 Other secondary cataract, bilateral: Secondary | ICD-10-CM | POA: Diagnosis not present

## 2016-09-17 ENCOUNTER — Encounter: Payer: Self-pay | Admitting: Gastroenterology

## 2016-10-03 DIAGNOSIS — K635 Polyp of colon: Secondary | ICD-10-CM | POA: Diagnosis not present

## 2016-10-03 DIAGNOSIS — I10 Essential (primary) hypertension: Secondary | ICD-10-CM | POA: Diagnosis not present

## 2016-10-03 DIAGNOSIS — Z23 Encounter for immunization: Secondary | ICD-10-CM | POA: Diagnosis not present

## 2016-10-27 ENCOUNTER — Encounter (HOSPITAL_COMMUNITY): Payer: Self-pay | Admitting: Cardiology

## 2016-10-27 ENCOUNTER — Emergency Department (HOSPITAL_COMMUNITY)
Admission: EM | Admit: 2016-10-27 | Discharge: 2016-10-27 | Disposition: A | Discharge status: Home / Self Care | Payer: Medicare HMO | Attending: Emergency Medicine | Admitting: Emergency Medicine

## 2016-10-27 DIAGNOSIS — J029 Acute pharyngitis, unspecified: Secondary | ICD-10-CM | POA: Diagnosis not present

## 2016-10-27 DIAGNOSIS — Z7982 Long term (current) use of aspirin: Secondary | ICD-10-CM | POA: Insufficient documentation

## 2016-10-27 DIAGNOSIS — Z79899 Other long term (current) drug therapy: Secondary | ICD-10-CM | POA: Diagnosis not present

## 2016-10-27 DIAGNOSIS — I10 Essential (primary) hypertension: Secondary | ICD-10-CM | POA: Diagnosis not present

## 2016-10-27 HISTORY — DX: Unspecified glaucoma: H40.9

## 2016-10-27 MED ORDER — AZITHROMYCIN 250 MG PO TABS: 250.0000 mg | ORAL_TABLET | Freq: Every day | ORAL | 0 refills | Status: DC

## 2016-10-27 NOTE — ED Provider Notes (Signed)
[facility] DEPT Provider Note   CSN: [id number] Arrival date & time: 10/27/16  [id number]     History   Chief Complaint Chief Complaint  Patient presents with  . Sore Throat    HPI Tracy Davenport is a 76 y.o. male.  HPI Patient presents the emergency department with 36 hours of headache sore throat and cough.  He denies fevers and chills.  He denies chest or back pain.  No abdominal pain.  Denies nausea vomiting and diarrhea.  No recent sick contacts.  Symptoms are moderate in severity.   Past Medical History:  Diagnosis Date  . Glaucoma   . Hypertension     Patient Active Problem List   Diagnosis Date Noted  . Special screening for malignant neoplasms, colon     Past Surgical History:  Procedure Laterality Date  . COLONOSCOPY    . COLONOSCOPY N/A 10/16/2015   Procedure: COLONOSCOPY;  Surgeon: Danie Binder, MD;  Location: [facility];  Service: Endoscopy;  Laterality: N/A;  1:30 pm       Home Medications    Prior to Admission medications   Medication Sig Start Date End Date Taking? Authorizing Provider  aspirin 325 MG tablet Take 325 mg by mouth daily as needed for pain (TAKES ONLY FOR OCCASIONAL PAIN).    Historical Provider, MD  azithromycin (ZITHROMAX Z-PAK) 250 MG tablet Take 1 tablet (250 mg total) by mouth daily. Take 2 tabs for first dose, then 1 tab for each additional dose 10/27/16   Jola Schmidt, MD  bimatoprost (LUMIGAN) 0.01 % SOLN Apply 1 drop to eye at bedtime.    Historical Provider, MD  COMBIGAN 0.2-0.5 % ophthalmic solution Place 1 drop into both eyes 2 (two) times daily. 09/07/15   Historical Provider, MD  doxazosin (CARDURA) 8 MG tablet Take 8 mg by mouth at bedtime.    Historical Provider, MD  losartan-hydrochlorothiazide (HYZAAR) 100-12.5 MG tablet Take 1 tablet by mouth daily. 07/31/15   Historical Provider, MD  ondansetron (ZOFRAN) 4 MG tablet Take 1 tablet (4 mg total) by mouth every 8 (eight) hours as needed for nausea or vomiting.  07/16/14   Merryl Hacker, MD    Family History History reviewed. No pertinent family history.  Social History Social History  Substance Use Topics  . Smoking status: Never Smoker  . Smokeless tobacco: Never Used  . Alcohol use No     Allergies   Patient has no known allergies.   Review of Systems Review of Systems  All other systems reviewed and are negative.    Physical Exam Updated Vital Signs BP 125/82   Pulse 70   Temp 98.6 F (37 C) (Oral)   Resp 16   Ht 5\' 10"  (1.778 m)   Wt 176 lb (79.8 kg)   SpO2 96%   BMI 25.25 kg/m   Physical Exam  Constitutional: He is oriented to person, place, and time. He appears well-developed and well-nourished.  HENT:  Head: Normocephalic and atraumatic.  Posterior pharyngeal erythema.  No tonsillar swelling or exudate.  Uvula midline.  Tolerating secretions.  Oral airway patent.  Eyes: EOM are normal.  Neck: Normal range of motion.  Cardiovascular: Normal rate and regular rhythm.   Pulmonary/Chest: Effort normal and breath sounds normal.  Abdominal: Soft. There is no tenderness.  Musculoskeletal: Normal range of motion.  Neurological: He is alert and oriented to person, place, and time.  Skin: Skin is warm and dry.  Psychiatric: He has a normal mood  and affect. Judgment normal.  Nursing note and vitals reviewed.    ED Treatments / Results  Labs (all labs ordered are listed, but only abnormal results are displayed) Labs Reviewed - No data to display  EKG  EKG Interpretation None       Radiology No results found.  Procedures Procedures (including critical care time)  Medications Ordered in ED Medications - No data to display   Initial Impression / Assessment and Plan / ED Course  I have reviewed the triage vital signs and the nursing notes.  Pertinent labs & imaging results that were available during my care of the patient were reviewed by me and considered in my medical decision making (see chart  for details).     Well-appearing.  Bronchitis/pharyngitis.  Home with antibiotics.  Primary care follow-up.  Final Clinical Impressions(s) / ED Diagnoses   Final diagnoses:  Pharyngitis, unspecified etiology    New Prescriptions New Prescriptions   AZITHROMYCIN (ZITHROMAX Z-PAK) 250 MG TABLET    Take 1 tablet (250 mg total) by mouth daily. Take 2 tabs for first dose, then 1 tab for each additional dose     Jola Schmidt, MD 10/27/16 [phone]

## 2016-10-27 NOTE — ED Triage Notes (Signed)
Sore throat,  Cough and headache since Friday.  States he feels like he has been running a fever.

## 2016-11-01 ENCOUNTER — Other Ambulatory Visit: Payer: Self-pay

## 2016-11-01 ENCOUNTER — Encounter: Payer: Self-pay | Admitting: Gastroenterology

## 2016-11-01 ENCOUNTER — Ambulatory Visit (INDEPENDENT_AMBULATORY_CARE_PROVIDER_SITE_OTHER): Payer: Medicare HMO | Admitting: Gastroenterology

## 2016-11-01 DIAGNOSIS — D126 Benign neoplasm of colon, unspecified: Secondary | ICD-10-CM

## 2016-11-01 DIAGNOSIS — Z86010 Personal history of colonic polyps: Secondary | ICD-10-CM

## 2016-11-01 MED ORDER — PEG 3350-KCL-NA BICARB-NACL 420 G PO SOLR: 4000.0000 mL | ORAL | 0 refills | Status: DC

## 2016-11-01 NOTE — Assessment & Plan Note (Signed)
76 year old male with multiple adenomas (three that were 6-12 mm) on colonoscopy last year; he is due for 1 year surveillance now. No concerning lower or upper GI symptoms.  Proceed with colonoscopy with Dr. Oneida Alar in the near future. The risks, benefits, and alternatives have been discussed in detail with the patient. They state understanding and desire to proceed.

## 2016-11-01 NOTE — Progress Notes (Signed)
Referring Provider: Rosita Fire, MD Primary Care Physician:  Rosita Fire, MD  Primary GI: Dr. Oneida Alar   Chief Complaint  Patient presents with  . Colonoscopy    HPI:   Tracy Davenport is a 76 y.o. male presenting today with a history of multiple adenomas in 2017 (three were 6-12 mm), and needs 1 year surveillance due to size and number of polyps.   Has occasional constipation every now and then. Doesn't feel like he needs anything for it. No rectal bleeding. No abdominal pain, N/V. Good appetite most of the time. No dysphagia. Rare heartburn if eats something he shouldn't, specifically at nighttime.   Past Medical History:  Diagnosis Date  . Glaucoma   . Hypertension     Past Surgical History:  Procedure Laterality Date  . COLONOSCOPY    . COLONOSCOPY N/A 10/16/2015   Dr. Oneida Alar: multiple colon polyps, tubular adenomas (three were 6-12 mm). Early interval due in 2018     Current Outpatient Prescriptions  Medication Sig Dispense Refill  . aspirin 325 MG tablet Take 325 mg by mouth daily as needed for pain (TAKES ONLY FOR OCCASIONAL PAIN).    Marland Kitchen bimatoprost (LUMIGAN) 0.01 % SOLN Apply 1 drop to eye at bedtime.    . COMBIGAN 0.2-0.5 % ophthalmic solution Place 1 drop into both eyes 2 (two) times daily.  3  . doxazosin (CARDURA) 8 MG tablet Take 8 mg by mouth at bedtime.    Marland Kitchen losartan-hydrochlorothiazide (HYZAAR) 100-12.5 MG tablet Take 1 tablet by mouth daily.  3   No current facility-administered medications for this visit.     Allergies as of 11/01/2016  . (No Known Allergies)    Family History  Problem Relation Age of Onset  . Colon cancer Neg Hx     Social History   Social History  . Marital status: Married    Spouse name: N/A  . Number of children: N/A  . Years of education: N/A   Occupational History  . Retired     Geophysicist/field seismologist    Social History Main Topics  . Smoking status: Never Smoker  . Smokeless tobacco: Never Used  . Alcohol use No  . Drug  use: No  . Sexual activity: Not Asked   Other Topics Concern  . None   Social History Narrative  . None    Review of Systems: Gen: Denies fever, chills, anorexia. Denies fatigue, weakness, weight loss.  CV: Denies chest pain, palpitations, syncope, peripheral edema, and claudication. Resp: Denies dyspnea at rest, cough, wheezing, coughing up blood, and pleurisy. GI: see HPI  Derm: Denies rash, itching, dry skin Psych: Denies depression, anxiety, memory loss, confusion. No homicidal or suicidal ideation.  Heme: see HPI   Physical Exam: BP (!) 100/57   Pulse 70   Temp 98.3 F (36.8 C) (Oral)   Ht 5\' 10"  (1.778 m)   Wt 176 lb 6.4 oz (80 kg)   BMI 25.31 kg/m  General:   Alert and oriented. No distress noted. Pleasant and cooperative.  Head:  Normocephalic and atraumatic. Eyes:  Conjuctiva clear without scleral icterus. Mouth:  Oral mucosa pink and moist. Good dentition. No lesions. Heart:  S1, S2 present without murmurs, rubs, or gallops. Regular rate and rhythm. Abdomen:  +BS, soft, non-tender and non-distended. No rebound or guarding. No HSM or masses noted. Msk:  Symmetrical without gross deformities. Normal posture. Extremities:  Without edema. Neurologic:  Alert and  oriented x4;  grossly normal neurologically. Psych:  Alert  and cooperative. Normal mood and affect.

## 2016-11-01 NOTE — Progress Notes (Signed)
cc'ed to pcp °

## 2016-11-01 NOTE — Patient Instructions (Signed)
We have scheduled for a colonoscopy with Dr. Oneida Alar.  Further recommendations to follow.

## 2016-11-04 NOTE — Patient Instructions (Signed)
PA for TCS, Certification# [id number]. 11/04/16-12/08/16

## 2016-11-08 ENCOUNTER — Encounter (HOSPITAL_COMMUNITY)
Admission: RE | Disposition: A | Discharge status: Home / Self Care | Payer: Self-pay | Source: Ambulatory Visit | Attending: Gastroenterology

## 2016-11-08 ENCOUNTER — Ambulatory Visit (HOSPITAL_COMMUNITY)
Admission: RE | Admit: 2016-11-08 | Discharge: 2016-11-08 | Disposition: A | Discharge status: Home / Self Care | Payer: Medicare HMO | Source: Ambulatory Visit | Attending: Gastroenterology | Admitting: Gastroenterology

## 2016-11-08 ENCOUNTER — Encounter (HOSPITAL_COMMUNITY): Payer: Self-pay | Admitting: *Deleted

## 2016-11-08 DIAGNOSIS — Q438 Other specified congenital malformations of intestine: Secondary | ICD-10-CM | POA: Insufficient documentation

## 2016-11-08 DIAGNOSIS — K648 Other hemorrhoids: Secondary | ICD-10-CM | POA: Diagnosis not present

## 2016-11-08 DIAGNOSIS — D126 Benign neoplasm of colon, unspecified: Secondary | ICD-10-CM | POA: Diagnosis not present

## 2016-11-08 DIAGNOSIS — Z1211 Encounter for screening for malignant neoplasm of colon: Secondary | ICD-10-CM | POA: Insufficient documentation

## 2016-11-08 DIAGNOSIS — D125 Benign neoplasm of sigmoid colon: Secondary | ICD-10-CM | POA: Insufficient documentation

## 2016-11-08 DIAGNOSIS — I10 Essential (primary) hypertension: Secondary | ICD-10-CM | POA: Insufficient documentation

## 2016-11-08 DIAGNOSIS — Z86010 Personal history of colonic polyps: Secondary | ICD-10-CM | POA: Insufficient documentation

## 2016-11-08 DIAGNOSIS — D122 Benign neoplasm of ascending colon: Secondary | ICD-10-CM | POA: Diagnosis not present

## 2016-11-08 DIAGNOSIS — G4730 Sleep apnea, unspecified: Secondary | ICD-10-CM | POA: Diagnosis not present

## 2016-11-08 DIAGNOSIS — D123 Benign neoplasm of transverse colon: Secondary | ICD-10-CM | POA: Diagnosis not present

## 2016-11-08 DIAGNOSIS — H409 Unspecified glaucoma: Secondary | ICD-10-CM | POA: Diagnosis not present

## 2016-11-08 DIAGNOSIS — Z79899 Other long term (current) drug therapy: Secondary | ICD-10-CM | POA: Diagnosis not present

## 2016-11-08 HISTORY — DX: Sleep apnea, unspecified: G47.30

## 2016-11-08 HISTORY — PX: COLONOSCOPY: SHX5424

## 2016-11-08 SURGERY — COLONOSCOPY
Anesthesia: Moderate Sedation

## 2016-11-08 MED ORDER — MIDAZOLAM HCL 5 MG/5ML IJ SOLN
INTRAMUSCULAR | Status: AC
  Filled 2016-11-08: qty 10

## 2016-11-08 MED ORDER — MIDAZOLAM HCL 5 MG/5ML IJ SOLN
INTRAMUSCULAR | Status: DC | PRN
  Administered 2016-11-08: 2 mg via INTRAVENOUS
  Administered 2016-11-08: 1 mg via INTRAVENOUS

## 2016-11-08 MED ORDER — MEPERIDINE HCL 100 MG/ML IJ SOLN
INTRAMUSCULAR | Status: DC | PRN
  Administered 2016-11-08 (×2): 25 mg via INTRAVENOUS

## 2016-11-08 MED ORDER — SODIUM CHLORIDE 0.9 % IV SOLN
INTRAVENOUS | Status: DC
  Administered 2016-11-08: 1000 mL via INTRAVENOUS

## 2016-11-08 MED ORDER — MEPERIDINE HCL 100 MG/ML IJ SOLN
INTRAMUSCULAR | Status: AC
  Filled 2016-11-08: qty 2

## 2016-11-08 MED ORDER — SIMETHICONE 40 MG/0.6ML PO SUSP
ORAL | Status: DC | PRN
  Administered 2016-11-08: 100 mL

## 2016-11-08 NOTE — H&P (Signed)
   Primary Care Physician:  Rosita Fire, MD Primary Gastroenterologist:  Dr. Oneida Alar  Pre-Procedure History & Physical: HPI:  Tracy Davenport is a 76 y.o. male here for  PERSONAL HISTORY OF POLYPS.  Past Medical History:  Diagnosis Date  . Glaucoma   . Hypertension   . Sleep apnea     Past Surgical History:  Procedure Laterality Date  . COLONOSCOPY    . COLONOSCOPY N/A 10/16/2015   Dr. Oneida Alar: multiple colon polyps, tubular adenomas (three were 6-12 mm). Early interval due in 2018     Prior to Admission medications   Medication Sig Start Date End Date Taking? Authorizing Provider  bimatoprost (LUMIGAN) 0.01 % SOLN Place 1 drop into both eyes at bedtime.    Yes Historical Provider, MD  COMBIGAN 0.2-0.5 % ophthalmic solution Place 1 drop into both eyes 2 (two) times daily. 09/07/15  Yes Historical Provider, MD  doxazosin (CARDURA) 8 MG tablet Take 8 mg by mouth at bedtime.   Yes Historical Provider, MD  losartan-hydrochlorothiazide (HYZAAR) 100-12.5 MG tablet Take 1 tablet by mouth daily. 07/31/15  Yes Historical Provider, MD    Allergies as of 11/01/2016  . (No Known Allergies)    Family History  Problem Relation Age of Onset  . Diabetes Mother   . Heart Problems Father   . Diabetes Brother   . Colon cancer Neg Hx     Social History   Social History  . Marital status: Married    Spouse name: N/A  . Number of children: N/A  . Years of education: N/A   Occupational History  . Retired     Geophysicist/field seismologist    Social History Main Topics  . Smoking status: Never Smoker  . Smokeless tobacco: Never Used  . Alcohol use Yes     Comment: a beer every now and then  . Drug use: No  . Sexual activity: Not on file   Other Topics Concern  . Not on file   Social History Narrative  . No narrative on file    Review of Systems: See HPI, otherwise negative ROS   Physical Exam: BP 118/74   Pulse 63   Temp 97.6 F (36.4 C) (Oral)   Resp 12   Ht 5\' 10"  (1.778 m)   Wt 176 lb  (79.8 kg)   SpO2 98%   BMI 25.25 kg/m  General:   Alert,  pleasant and cooperative in NAD Head:  Normocephalic and atraumatic. Neck:  Supple; Lungs:  Clear throughout to auscultation.    Heart:  Regular rate and rhythm. Abdomen:  Soft, nontender and nondistended. Normal bowel sounds, without guarding, and without rebound.   Neurologic:  Alert and  oriented x4;  grossly normal neurologically.  Impression/Plan:     PERSONAL HISTORY OF POLYPS.  PLAN: 1. TCS TODAY. DISCUSSED PROCEDURE, BENEFITS, & RISKS: < 1% chance of medication reaction, bleeding, perforation, or rupture of spleen/liver.

## 2016-11-08 NOTE — Op Note (Signed)
[facility] Patient Name: Tracy Davenport Procedure Date: 11/08/2016 2:01 PM MRN: [id number] Date of Birth: 1940-12-13 Attending MD: Barney Drain , MD CSN: [id number] Age: 76 Admit Type: Outpatient Procedure:                Colonoscopy WITH SNARE POLYPECTOMY Indications:              High risk colon cancer surveillance: Personal                            history of colonic polyps Providers:                Barney Drain, MD, Rosina Lowenstein, RN, Isabella Stalling,                            Technician Referring MD:             Rosita Fire Medicines:                Meperidine 50 mg IV, Midazolam 3 mg IV Complications:            No immediate complications. Estimated Blood Loss:     Estimated blood loss was minimal. Procedure:                Pre-Anesthesia Assessment:                           - Prior to the procedure, a History and Physical                            was performed, and patient medications and                            allergies were reviewed. The patient's tolerance of                            previous anesthesia was also reviewed. The risks                            and benefits of the procedure and the sedation                            options and risks were discussed with the patient.                            All questions were answered, and informed consent                            was obtained. Prior Anticoagulants: The patient has                            taken no previous anticoagulant or antiplatelet                            agents. ASA Grade Assessment: II - A patient with  mild systemic disease. After reviewing the risks                            and benefits, the patient was deemed in                            satisfactory condition to undergo the procedure.                            After obtaining informed consent, the colonoscope                            was passed under direct vision. Throughout the                    procedure, the patient's blood pressure, pulse, and                            oxygen saturations were monitored continuously. The                            [id number] scope was introduced through                            the anus and advanced to the the cecum, identified                            by appendiceal orifice and ileocecal valve. The                            ileocecal valve, appendiceal orifice, and rectum                            were photographed. The colonoscopy was somewhat                            difficult due to a tortuous colon. Successful                            completion of the procedure was aided by applying                            abdominal pressure and COLOWRAP. The quality of the                            bowel preparation was good. Scope In: 2:37:50 PM Scope Out: 2:59:39 PM Scope Withdrawal Time: 0 hours 17 minutes 23 seconds  Total Procedure Duration: 0 hours 21 minutes 49 seconds  Findings:      Two sessile polyps were found in the sigmoid colon and ascending colon.       The polyps were 4 to 5 mm in size. These polyps were removed with a hot       snare. Resection and retrieval were complete.      The recto-sigmoid colon and sigmoid colon were moderately redundant.  Internal hemorrhoids were found during retroflexion. The hemorrhoids       were small. Impression:               - Two polyps in the sigmoid colon and in the                            ascending colon, removed with a hot snare. Resected                            and retrieved.                           - Redundant LEFT colon.                           - Internal hemorrhoids. Moderate Sedation:      Moderate (conscious) sedation was administered by the endoscopy nurse       and supervised by the endoscopist. The following parameters were       monitored: oxygen saturation, heart rate, blood pressure, and response       to care. Total physician  intraservice time was 37 minutes. Recommendation:           - High fiber diet.                           - Continue present medications.                           - Await pathology results.                           - Repeat colonoscopy in 3 years for surveillance.                           - Patient has a contact number available for                            emergencies. The signs and symptoms of potential                            delayed complications were discussed with the                            patient. Return to normal activities tomorrow.                            Written discharge instructions were provided to the                            patient. Procedure Code(s):        --- Professional ---                           [phone], Colonoscopy, flexible; with removal of  tumor(s), polyp(s), or other lesion(s) by snare                            technique                           99152, Moderate sedation services provided by the                            same physician or other qualified health care                            professional performing the diagnostic or                            therapeutic service that the sedation supports,                            requiring the presence of an independent trained                            observer to assist in the monitoring of the                            patient's level of consciousness and physiological                            status; initial 15 minutes of intraservice time,                            patient age 71 years or older                           [phone], Moderate sedation services; each additional                            15 minutes intraservice time Diagnosis Code(s):        --- Professional ---                           Z86.010, Personal history of colonic polyps                           D12.5, Benign neoplasm of sigmoid colon                           D12.2, Benign neoplasm  of ascending colon                           K64.8, Other hemorrhoids                           Q43.8, Other specified congenital malformations of                            intestine CPT copyright 2016  American [employer]. All rights reserved. The codes documented in this report are preliminary and upon coder review may  be revised to meet current compliance requirements. Barney Drain, MD Barney Drain, MD 11/08/2016 3:09:39 PM This report has been signed electronically. Number of Addenda: 0

## 2016-11-08 NOTE — Discharge Instructions (Signed)
You had 2 SMALL polyps removed. You have internal hemorrhoids.   CONTINUE YOUR WEIGHT LOSS EFFORTS. LOSE TEN POUNDS.  DRINK WATER TO KEEP YOUR URINE LIGHT YELLOW.  FOLLOW A HIGH FIBER DIET. AVOID ITEMS THAT CAUSE BLOATING & GAS. SEE INFO BELOW.  YOUR BIOPSY RESULTS WILL BE AVAILABLE IN MY CHART AFTER FEB 6 AND MY OFFICE WILL CONTACT YOU IN 10-14 DAYS WITH YOUR RESULTS.   Next colonoscopy in 3 years.    Colonoscopy Care After Read the instructions outlined below and refer to this sheet in the next week. These discharge instructions provide you with general information on caring for yourself after you leave the hospital. While your treatment has been planned according to the most current medical practices available, unavoidable complications occasionally occur. If you have any problems or questions after discharge, call DR. Cleotis Sparr, [phone].  ACTIVITY  You may resume your regular activity, but move at a slower pace for the next 24 hours.   Take frequent rest periods for the next 24 hours.   Walking will help get rid of the air and reduce the bloated feeling in your belly (abdomen).   No driving for 24 hours (because of the medicine (anesthesia) used during the test).   You may shower.   Do not sign any important legal documents or operate any machinery for 24 hours (because of the anesthesia used during the test).    NUTRITION  Drink plenty of fluids.   You may resume your normal diet as instructed by your doctor.   Begin with a light meal and progress to your normal diet. Heavy or fried foods are harder to digest and may make you feel sick to your stomach (nauseated).   Avoid alcoholic beverages for 24 hours or as instructed.    MEDICATIONS  You may resume your normal medications.   WHAT YOU CAN EXPECT TODAY  Some feelings of bloating in the abdomen.   Passage of more gas than usual.   Spotting of blood in your stool or on the toilet paper  .  IF YOU HAD  POLYPS REMOVED DURING THE COLONOSCOPY:  Eat a soft diet IF YOU HAVE NAUSEA, BLOATING, ABDOMINAL PAIN, OR VOMITING.    FINDING OUT THE RESULTS OF YOUR TEST Not all test results are available during your visit. DR. Oneida Alar WILL CALL YOU WITHIN 14 DAYS OF YOUR PROCEDUE WITH YOUR RESULTS. Do not assume everything is normal if you have not heard from DR. Ayari Liwanag, CALL HER OFFICE AT [phone].  SEEK IMMEDIATE MEDICAL ATTENTION AND CALL THE OFFICE: [phone] IF:  You have more than a spotting of blood in your stool.   Your belly is swollen (abdominal distention).   You are nauseated or vomiting.   You have a temperature over 101F.   You have abdominal pain or discomfort that is severe or gets worse throughout the day.   High-Fiber Diet A high-fiber diet changes your normal diet to include more whole grains, legumes, fruits, and vegetables. Changes in the diet involve replacing refined carbohydrates with unrefined foods. The calorie level of the diet is essentially unchanged. The Dietary Reference Intake (recommended amount) for adult males is 38 grams per day. For adult females, it is 25 grams per day. Pregnant and lactating women should consume 28 grams of fiber per day. Fiber is the intact part of a plant that is not broken down during digestion. Functional fiber is fiber that has been isolated from the plant to provide a beneficial effect in the  body. PURPOSE  Increase stool bulk.   Ease and regulate bowel movements.   Lower cholesterol.   REDUCE RISK OF COLON CANCER  INDICATIONS THAT YOU NEED MORE FIBER  Constipation and hemorrhoids.   Uncomplicated diverticulosis (intestine condition) and irritable bowel syndrome.   Weight management.   As a protective measure against hardening of the arteries (atherosclerosis), diabetes, and cancer.   GUIDELINES FOR INCREASING FIBER IN THE DIET  Start adding fiber to the diet slowly. A gradual increase of about 5 more grams (2 slices  of whole-wheat bread, 2 servings of most fruits or vegetables, or 1 bowl of high-fiber cereal) per day is best. Too rapid an increase in fiber may result in constipation, flatulence, and bloating.   Drink enough water and fluids to keep your urine clear or pale yellow. Water, juice, or caffeine-free drinks are recommended. Not drinking enough fluid may cause constipation.   Eat a variety of high-fiber foods rather than one type of fiber.   Try to increase your intake of fiber through using high-fiber foods rather than fiber pills or supplements that contain small amounts of fiber.   The goal is to change the types of food eaten. Do not supplement your present diet with high-fiber foods, but replace foods in your present diet.   INCLUDE A VARIETY OF FIBER SOURCES  Replace refined and processed grains with whole grains, canned fruits with fresh fruits, and incorporate other fiber sources. White rice, white breads, and most bakery goods contain little or no fiber.   Brown whole-grain rice, buckwheat oats, and many fruits and vegetables are all good sources of fiber. These include: broccoli, Brussels sprouts, cabbage, cauliflower, beets, sweet potatoes, white potatoes (skin on), carrots, tomatoes, eggplant, squash, berries, fresh fruits, and dried fruits.   Cereals appear to be the richest source of fiber. Cereal fiber is found in whole grains and bran. Bran is the fiber-rich outer coat of cereal grain, which is largely removed in refining. In whole-grain cereals, the bran remains. In breakfast cereals, the largest amount of fiber is found in those with "bran" in their names. The fiber content is sometimes indicated on the label.   You may need to include additional fruits and vegetables each day.   In baking, for 1 cup white flour, you may use the following substitutions:   1 cup whole-wheat flour minus 2 tablespoons.   1/2 cup white flour plus 1/2 cup whole-wheat flour.   Polyps, Colon  A  polyp is extra tissue that grows inside your body. Colon polyps grow in the large intestine. The large intestine, also called the colon, is part of your digestive system. It is a long, hollow tube at the end of your digestive tract where your body makes and stores stool. Most polyps are not dangerous. They are benign. This means they are not cancerous. But over time, some types of polyps can turn into cancer. Polyps that are smaller than a pea are usually not harmful. But larger polyps could someday become or may already be cancerous. To be safe, doctors remove all polyps and test them.   PREVENTION There is not one sure way to prevent polyps. You might be able to lower your risk of getting them if you:  Eat more fruits and vegetables and less fatty food.   Do not smoke.   Avoid alcohol.   Exercise every day.   Lose weight if you are overweight.   Eating more calcium and folate can also lower your risk  of getting polyps. Some foods that are rich in calcium are milk, cheese, and broccoli. Some foods that are rich in folate are chickpeas, kidney beans, and spinach.

## 2016-11-13 ENCOUNTER — Encounter (HOSPITAL_COMMUNITY): Payer: Self-pay | Admitting: Gastroenterology

## 2016-11-17 ENCOUNTER — Telehealth: Payer: Self-pay | Admitting: Gastroenterology

## 2016-11-17 NOTE — Telephone Encounter (Signed)
Please call pt. HE had TWO simple adenomas removed.   CONTINUE YOUR WEIGHT LOSS EFFORTS. LOSE TEN POUNDS.  DRINK WATER TO KEEP YOUR URINE LIGHT YELLOW.  FOLLOW A HIGH FIBER DIET. AVOID ITEMS THAT CAUSE BLOATING & GAS.  Next colonoscopy in 3 years.

## 2016-11-18 NOTE — Telephone Encounter (Signed)
Reminder in epic °

## 2016-11-18 NOTE — Telephone Encounter (Signed)
Left message for pt to call.

## 2016-11-20 NOTE — Telephone Encounter (Signed)
LMOM to call.

## 2016-11-21 NOTE — Telephone Encounter (Signed)
PT is aware.

## 2016-12-12 DIAGNOSIS — H26493 Other secondary cataract, bilateral: Secondary | ICD-10-CM | POA: Diagnosis not present

## 2016-12-12 DIAGNOSIS — Z961 Presence of intraocular lens: Secondary | ICD-10-CM | POA: Diagnosis not present

## 2016-12-12 DIAGNOSIS — H401132 Primary open-angle glaucoma, bilateral, moderate stage: Secondary | ICD-10-CM | POA: Diagnosis not present

## 2017-01-02 DIAGNOSIS — I10 Essential (primary) hypertension: Secondary | ICD-10-CM | POA: Diagnosis not present

## 2017-01-02 DIAGNOSIS — H409 Unspecified glaucoma: Secondary | ICD-10-CM | POA: Diagnosis not present

## 2017-04-17 DIAGNOSIS — H401132 Primary open-angle glaucoma, bilateral, moderate stage: Secondary | ICD-10-CM | POA: Diagnosis not present

## 2017-04-17 DIAGNOSIS — H26493 Other secondary cataract, bilateral: Secondary | ICD-10-CM | POA: Diagnosis not present

## 2017-04-17 DIAGNOSIS — Z961 Presence of intraocular lens: Secondary | ICD-10-CM | POA: Diagnosis not present

## 2017-05-01 DIAGNOSIS — I10 Essential (primary) hypertension: Secondary | ICD-10-CM | POA: Diagnosis not present

## 2017-05-01 DIAGNOSIS — H409 Unspecified glaucoma: Secondary | ICD-10-CM | POA: Diagnosis not present

## 2017-08-06 DIAGNOSIS — Z23 Encounter for immunization: Secondary | ICD-10-CM | POA: Diagnosis not present

## 2017-08-21 DIAGNOSIS — H401132 Primary open-angle glaucoma, bilateral, moderate stage: Secondary | ICD-10-CM | POA: Diagnosis not present

## 2017-08-21 DIAGNOSIS — H26493 Other secondary cataract, bilateral: Secondary | ICD-10-CM | POA: Diagnosis not present

## 2017-08-21 DIAGNOSIS — Z961 Presence of intraocular lens: Secondary | ICD-10-CM | POA: Diagnosis not present

## 2017-08-26 DIAGNOSIS — Z1389 Encounter for screening for other disorder: Secondary | ICD-10-CM | POA: Diagnosis not present

## 2017-08-26 DIAGNOSIS — R739 Hyperglycemia, unspecified: Secondary | ICD-10-CM | POA: Diagnosis not present

## 2017-08-26 DIAGNOSIS — Z0001 Encounter for general adult medical examination with abnormal findings: Secondary | ICD-10-CM | POA: Diagnosis not present

## 2017-08-26 DIAGNOSIS — N401 Enlarged prostate with lower urinary tract symptoms: Secondary | ICD-10-CM | POA: Diagnosis not present

## 2017-08-26 DIAGNOSIS — Z0000 Encounter for general adult medical examination without abnormal findings: Secondary | ICD-10-CM | POA: Diagnosis not present

## 2017-08-26 DIAGNOSIS — I10 Essential (primary) hypertension: Secondary | ICD-10-CM | POA: Diagnosis not present

## 2017-10-24 DIAGNOSIS — G4733 Obstructive sleep apnea (adult) (pediatric): Secondary | ICD-10-CM | POA: Diagnosis not present

## 2017-12-23 DIAGNOSIS — I10 Essential (primary) hypertension: Secondary | ICD-10-CM | POA: Diagnosis not present

## 2017-12-23 DIAGNOSIS — H409 Unspecified glaucoma: Secondary | ICD-10-CM | POA: Diagnosis not present

## 2017-12-23 DIAGNOSIS — N401 Enlarged prostate with lower urinary tract symptoms: Secondary | ICD-10-CM | POA: Diagnosis not present

## 2018-02-19 DIAGNOSIS — H26493 Other secondary cataract, bilateral: Secondary | ICD-10-CM | POA: Diagnosis not present

## 2018-02-19 DIAGNOSIS — Z961 Presence of intraocular lens: Secondary | ICD-10-CM | POA: Diagnosis not present

## 2018-02-19 DIAGNOSIS — H401133 Primary open-angle glaucoma, bilateral, severe stage: Secondary | ICD-10-CM | POA: Diagnosis not present

## 2018-03-24 DIAGNOSIS — I10 Essential (primary) hypertension: Secondary | ICD-10-CM | POA: Diagnosis not present

## 2018-03-24 DIAGNOSIS — N401 Enlarged prostate with lower urinary tract symptoms: Secondary | ICD-10-CM | POA: Diagnosis not present

## 2018-03-24 DIAGNOSIS — G4730 Sleep apnea, unspecified: Secondary | ICD-10-CM | POA: Diagnosis not present

## 2018-06-23 DIAGNOSIS — I10 Essential (primary) hypertension: Secondary | ICD-10-CM | POA: Diagnosis not present

## 2018-06-23 DIAGNOSIS — Z23 Encounter for immunization: Secondary | ICD-10-CM | POA: Diagnosis not present

## 2018-06-23 DIAGNOSIS — N401 Enlarged prostate with lower urinary tract symptoms: Secondary | ICD-10-CM | POA: Diagnosis not present

## 2018-07-23 DIAGNOSIS — H26493 Other secondary cataract, bilateral: Secondary | ICD-10-CM | POA: Diagnosis not present

## 2018-07-23 DIAGNOSIS — H401133 Primary open-angle glaucoma, bilateral, severe stage: Secondary | ICD-10-CM | POA: Diagnosis not present

## 2018-07-23 DIAGNOSIS — Z961 Presence of intraocular lens: Secondary | ICD-10-CM | POA: Diagnosis not present

## 2018-09-15 DIAGNOSIS — Z1331 Encounter for screening for depression: Secondary | ICD-10-CM | POA: Diagnosis not present

## 2018-09-15 DIAGNOSIS — K635 Polyp of colon: Secondary | ICD-10-CM | POA: Diagnosis not present

## 2018-09-15 DIAGNOSIS — Z0001 Encounter for general adult medical examination with abnormal findings: Secondary | ICD-10-CM | POA: Diagnosis not present

## 2018-09-15 DIAGNOSIS — N401 Enlarged prostate with lower urinary tract symptoms: Secondary | ICD-10-CM | POA: Diagnosis not present

## 2018-09-15 DIAGNOSIS — R739 Hyperglycemia, unspecified: Secondary | ICD-10-CM | POA: Diagnosis not present

## 2018-09-15 DIAGNOSIS — Z1389 Encounter for screening for other disorder: Secondary | ICD-10-CM | POA: Diagnosis not present

## 2018-09-15 DIAGNOSIS — I10 Essential (primary) hypertension: Secondary | ICD-10-CM | POA: Diagnosis not present

## 2018-09-15 DIAGNOSIS — Z0000 Encounter for general adult medical examination without abnormal findings: Secondary | ICD-10-CM | POA: Diagnosis not present

## 2018-11-26 DIAGNOSIS — Z961 Presence of intraocular lens: Secondary | ICD-10-CM | POA: Diagnosis not present

## 2018-11-26 DIAGNOSIS — H26493 Other secondary cataract, bilateral: Secondary | ICD-10-CM | POA: Diagnosis not present

## 2018-11-26 DIAGNOSIS — H401133 Primary open-angle glaucoma, bilateral, severe stage: Secondary | ICD-10-CM | POA: Diagnosis not present

## 2018-12-22 DIAGNOSIS — I10 Essential (primary) hypertension: Secondary | ICD-10-CM | POA: Diagnosis not present

## 2018-12-22 DIAGNOSIS — H409 Unspecified glaucoma: Secondary | ICD-10-CM | POA: Diagnosis not present

## 2018-12-22 DIAGNOSIS — N401 Enlarged prostate with lower urinary tract symptoms: Secondary | ICD-10-CM | POA: Diagnosis not present

## 2019-03-23 DIAGNOSIS — H409 Unspecified glaucoma: Secondary | ICD-10-CM | POA: Diagnosis not present

## 2019-03-23 DIAGNOSIS — N401 Enlarged prostate with lower urinary tract symptoms: Secondary | ICD-10-CM | POA: Diagnosis not present

## 2019-03-23 DIAGNOSIS — I10 Essential (primary) hypertension: Secondary | ICD-10-CM | POA: Diagnosis not present

## 2019-04-12 ENCOUNTER — Other Ambulatory Visit: Payer: Medicare HMO

## 2019-04-12 ENCOUNTER — Other Ambulatory Visit: Payer: Self-pay

## 2019-04-12 DIAGNOSIS — Z20822 Contact with and (suspected) exposure to covid-19: Secondary | ICD-10-CM

## 2019-04-12 DIAGNOSIS — R6889 Other general symptoms and signs: Secondary | ICD-10-CM | POA: Diagnosis not present

## 2019-04-12 NOTE — Progress Notes (Signed)
[id number] 

## 2019-04-17 LAB — NOVEL CORONAVIRUS, NAA: SARS-CoV-2, NAA: NOT DETECTED

## 2019-04-22 DIAGNOSIS — H409 Unspecified glaucoma: Secondary | ICD-10-CM | POA: Diagnosis not present

## 2019-04-22 DIAGNOSIS — I10 Essential (primary) hypertension: Secondary | ICD-10-CM | POA: Diagnosis not present

## 2019-04-29 DIAGNOSIS — H401133 Primary open-angle glaucoma, bilateral, severe stage: Secondary | ICD-10-CM | POA: Diagnosis not present

## 2019-04-29 DIAGNOSIS — H26493 Other secondary cataract, bilateral: Secondary | ICD-10-CM | POA: Diagnosis not present

## 2019-04-29 DIAGNOSIS — Z961 Presence of intraocular lens: Secondary | ICD-10-CM | POA: Diagnosis not present

## 2019-05-23 DIAGNOSIS — H409 Unspecified glaucoma: Secondary | ICD-10-CM | POA: Diagnosis not present

## 2019-05-23 DIAGNOSIS — I10 Essential (primary) hypertension: Secondary | ICD-10-CM | POA: Diagnosis not present

## 2019-06-23 DIAGNOSIS — I10 Essential (primary) hypertension: Secondary | ICD-10-CM | POA: Diagnosis not present

## 2019-06-23 DIAGNOSIS — H409 Unspecified glaucoma: Secondary | ICD-10-CM | POA: Diagnosis not present

## 2019-07-20 DIAGNOSIS — Z23 Encounter for immunization: Secondary | ICD-10-CM | POA: Diagnosis not present

## 2019-07-23 DIAGNOSIS — I10 Essential (primary) hypertension: Secondary | ICD-10-CM | POA: Diagnosis not present

## 2019-07-23 DIAGNOSIS — H409 Unspecified glaucoma: Secondary | ICD-10-CM | POA: Diagnosis not present

## 2019-09-14 DIAGNOSIS — H409 Unspecified glaucoma: Secondary | ICD-10-CM | POA: Diagnosis not present

## 2019-09-14 DIAGNOSIS — Z1331 Encounter for screening for depression: Secondary | ICD-10-CM | POA: Diagnosis not present

## 2019-09-14 DIAGNOSIS — N401 Enlarged prostate with lower urinary tract symptoms: Secondary | ICD-10-CM | POA: Diagnosis not present

## 2019-09-14 DIAGNOSIS — Z1389 Encounter for screening for other disorder: Secondary | ICD-10-CM | POA: Diagnosis not present

## 2019-09-14 DIAGNOSIS — Z0001 Encounter for general adult medical examination with abnormal findings: Secondary | ICD-10-CM | POA: Diagnosis not present

## 2019-09-14 DIAGNOSIS — I10 Essential (primary) hypertension: Secondary | ICD-10-CM | POA: Diagnosis not present

## 2019-09-17 DIAGNOSIS — H409 Unspecified glaucoma: Secondary | ICD-10-CM | POA: Diagnosis not present

## 2019-09-17 DIAGNOSIS — I10 Essential (primary) hypertension: Secondary | ICD-10-CM | POA: Diagnosis not present

## 2019-09-17 DIAGNOSIS — Z0001 Encounter for general adult medical examination with abnormal findings: Secondary | ICD-10-CM | POA: Diagnosis not present

## 2019-10-15 DIAGNOSIS — I10 Essential (primary) hypertension: Secondary | ICD-10-CM | POA: Diagnosis not present

## 2019-10-15 DIAGNOSIS — H409 Unspecified glaucoma: Secondary | ICD-10-CM | POA: Diagnosis not present

## 2019-11-02 DIAGNOSIS — H401133 Primary open-angle glaucoma, bilateral, severe stage: Secondary | ICD-10-CM | POA: Diagnosis not present

## 2019-11-02 DIAGNOSIS — H26493 Other secondary cataract, bilateral: Secondary | ICD-10-CM | POA: Diagnosis not present

## 2019-11-09 ENCOUNTER — Encounter: Payer: Self-pay | Admitting: Gastroenterology

## 2019-11-13 ENCOUNTER — Other Ambulatory Visit: Payer: Self-pay

## 2019-11-13 ENCOUNTER — Ambulatory Visit: Payer: Medicare HMO | Attending: Internal Medicine

## 2019-11-13 DIAGNOSIS — Z23 Encounter for immunization: Secondary | ICD-10-CM | POA: Insufficient documentation

## 2019-11-13 NOTE — Progress Notes (Signed)
   Covid-19 Vaccination Clinic  Name:  Tracy Davenport    MRN: [id number] DOB: 06/02/1941  11/13/2019  [name] was observed post Covid-19 immunization for 15 minutes without incidence. He was provided with Vaccine Information Sheet and instruction to access the V-Safe system.   [name] was instructed to call 911 with any severe reactions post vaccine: Marland Kitchen Difficulty breathing  . Swelling of your face and throat  . A fast heartbeat  . A bad rash all over your body  . Dizziness and weakness    Immunizations Administered    Name Date Dose VIS Date Route   Moderna COVID-19 Vaccine 11/13/2019 11:17 AM 0.5 mL 09/07/2019 Intramuscular   Manufacturer: Moderna   Lot: [id number]   [id number]

## 2019-11-15 DIAGNOSIS — H409 Unspecified glaucoma: Secondary | ICD-10-CM | POA: Diagnosis not present

## 2019-11-15 DIAGNOSIS — I10 Essential (primary) hypertension: Secondary | ICD-10-CM | POA: Diagnosis not present

## 2019-11-30 DIAGNOSIS — H401133 Primary open-angle glaucoma, bilateral, severe stage: Secondary | ICD-10-CM | POA: Diagnosis not present

## 2019-12-13 DIAGNOSIS — I10 Essential (primary) hypertension: Secondary | ICD-10-CM | POA: Diagnosis not present

## 2019-12-13 DIAGNOSIS — H409 Unspecified glaucoma: Secondary | ICD-10-CM | POA: Diagnosis not present

## 2019-12-14 ENCOUNTER — Ambulatory Visit: Payer: Medicare HMO | Attending: Internal Medicine

## 2019-12-14 DIAGNOSIS — Z23 Encounter for immunization: Secondary | ICD-10-CM | POA: Insufficient documentation

## 2019-12-14 NOTE — Progress Notes (Signed)
   Covid-19 Vaccination Clinic  Name:  Tracy Davenport    MRN: [id number] DOB: 11-14-1940  12/14/2019  [name] was observed post Covid-19 immunization for 15 minutes without incident. He was provided with Vaccine Information Sheet and instruction to access the V-Safe system.   [name] was instructed to call 911 with any severe reactions post vaccine: Marland Kitchen Difficulty breathing  . Swelling of face and throat  . A fast heartbeat  . A bad rash all over body  . Dizziness and weakness   Immunizations Administered    Name Date Dose VIS Date Route   Moderna COVID-19 Vaccine 12/14/2019 10:23 AM 0.5 mL 09/07/2019 Intramuscular   Manufacturer: Moderna   Lot: [id number]   [id number]

## 2019-12-17 DIAGNOSIS — H401133 Primary open-angle glaucoma, bilateral, severe stage: Secondary | ICD-10-CM | POA: Diagnosis not present

## 2020-01-13 DIAGNOSIS — I10 Essential (primary) hypertension: Secondary | ICD-10-CM | POA: Diagnosis not present

## 2020-01-13 DIAGNOSIS — H409 Unspecified glaucoma: Secondary | ICD-10-CM | POA: Diagnosis not present

## 2020-01-23 ENCOUNTER — Other Ambulatory Visit: Payer: Self-pay

## 2020-01-23 ENCOUNTER — Emergency Department (HOSPITAL_COMMUNITY)
Admission: EM | Admit: 2020-01-23 | Discharge: 2020-01-23 | Disposition: A | Discharge status: Home / Self Care | Payer: Medicare HMO | Attending: Emergency Medicine | Admitting: Emergency Medicine

## 2020-01-23 ENCOUNTER — Encounter (HOSPITAL_COMMUNITY): Payer: Self-pay

## 2020-01-23 ENCOUNTER — Emergency Department (HOSPITAL_COMMUNITY): Payer: Medicare HMO

## 2020-01-23 DIAGNOSIS — I10 Essential (primary) hypertension: Secondary | ICD-10-CM | POA: Insufficient documentation

## 2020-01-23 DIAGNOSIS — Z79899 Other long term (current) drug therapy: Secondary | ICD-10-CM | POA: Diagnosis not present

## 2020-01-23 DIAGNOSIS — M25511 Pain in right shoulder: Secondary | ICD-10-CM | POA: Diagnosis not present

## 2020-01-23 MED ORDER — HYDROCODONE-ACETAMINOPHEN 5-325 MG PO TABS: 1.0000 | ORAL_TABLET | Freq: Four times a day (QID) | ORAL | 0 refills | Status: AC | PRN

## 2020-01-23 MED ORDER — NAPROXEN 375 MG PO TABS: 375.0000 mg | ORAL_TABLET | Freq: Two times a day (BID) | ORAL | 0 refills | Status: AC

## 2020-01-23 MED ORDER — KETOROLAC TROMETHAMINE 60 MG/2ML IM SOLN
30.0000 mg | Freq: Once | INTRAMUSCULAR | Status: AC
  Administered 2020-01-23: 08:00:00 30 mg via INTRAMUSCULAR
  Filled 2020-01-23: qty 2

## 2020-01-23 NOTE — Discharge Instructions (Addendum)
Take naproxen as prescribed.  Take hydrocodone as prescribed as needed for pain not relieved with naproxen.  Wear arm sling for the next several days for comfort, then gradually reintroduce activity as tolerated.  Follow-up with your primary doctor if symptoms or not improving in the next week.

## 2020-01-23 NOTE — ED Provider Notes (Signed)
[facility] EMERGENCY DEPARTMENT Provider Note   CSN: [id number] Arrival date & time: 01/23/20  [id number]     History Chief Complaint  Patient presents with  . Shoulder Pain    Tracy Davenport is a 79 y.o. male.  Patient is a 79 year old male with past medical history of hypertension.  He presents today with complaints of right shoulder pain.  This began 2 days ago in the absence of any specific injury or trauma.  He describes pain to the back of his shoulder that radiates into his fingertips.  His fingertips feel numb, but he denies any weakness.  He denies any fevers or chills.  Pain is worse with movement and range of motion and relieved somewhat with rest.  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  The history is provided by the patient.  Shoulder Pain Location:  Shoulder Shoulder location:  R shoulder Injury: no   Pain details:    Quality:  Aching   Radiates to:  R fingers   Severity:  Moderate   Onset quality:  Sudden   Duration:  2 days   Timing:  Constant   Progression:  Worsening Relieved by:  Nothing Worsened by:  Movement Ineffective treatments:  None tried      Past Medical History:  Diagnosis Date  . Glaucoma   . Hypertension   . Sleep apnea     Patient Active Problem List   Diagnosis Date Noted  . Colon adenomas 11/01/2016  . Special screening for malignant neoplasms, colon     Past Surgical History:  Procedure Laterality Date  . COLONOSCOPY    . COLONOSCOPY N/A 10/16/2015   Dr. Oneida Alar: multiple colon polyps, tubular adenomas (three were 6-12 mm). Early interval due in 2018   . COLONOSCOPY N/A 11/08/2016   Procedure: COLONOSCOPY;  Surgeon: Danie Binder, MD;  Location: [facility];  Service: Endoscopy;  Laterality: N/A;  1:00 pm       Family History  Problem Relation Age of Onset  . Diabetes Mother   . Heart Problems Father   . Diabetes Brother   . Colon cancer  Neg Hx     Social History   Tobacco Use  . Smoking status: Never Smoker  . Smokeless tobacco: Never Used  Substance Use Topics  . Alcohol use: Not Currently    Comment: a beer every now and then  . Drug use: No    Home Medications Prior to Admission medications   Medication Sig Start Date End Date Taking? Authorizing Provider  bimatoprost (LUMIGAN) 0.01 % SOLN Place 1 drop into both eyes at bedtime.     [provider]  COMBIGAN 0.2-0.5 % ophthalmic solution Place 1 drop into both eyes 2 (two) times daily. 09/07/15   [provider]  doxazosin (CARDURA) 8 MG tablet Take 8 mg by mouth at bedtime.    [provider]  losartan-hydrochlorothiazide (HYZAAR) 100-12.5 MG tablet Take 1 tablet by mouth daily. 07/31/15   [provider]    Allergies    Patient has no known allergies.  Review of Systems   Review of Systems  All other systems reviewed and are negative.   Physical Exam Updated Vital Signs BP (!) 148/105 (BP Location: Left Arm)   Pulse 84   Temp (!) 97.3 F (36.3 C) (Oral)   Ht 5\' 10"  (1.778 m)   Wt 82.1 kg   SpO2 98%  BMI 25.97 kg/m   Physical Exam Vitals and nursing note reviewed.  Constitutional:      General: He is not in acute distress.    Appearance: Normal appearance. He is not ill-appearing.  HENT:     Head: Normocephalic and atraumatic.  Pulmonary:     Effort: Pulmonary effort is normal.  Musculoskeletal:        General: Tenderness present.     Comments: There is tenderness to palpation to the posterior aspect of the shoulder.  He has good range of motion, however with discomfort.  Ulnar and radial pulses are easily palpable and motor and sensation are intact to the entire hand.  Skin:    General: Skin is warm and dry.  Neurological:     Mental Status: He is alert.     ED Results / Procedures / Treatments   Labs (all labs ordered are listed, but only abnormal results are displayed) Labs Reviewed - No data  to display  EKG None  Radiology No results found.  Procedures Procedures (including critical care time)  Medications Ordered in ED Medications  ketorolac (TORADOL) injection 30 mg (has no administration in time range)    ED Course  I have reviewed the triage vital signs and the nursing notes.  Pertinent labs & imaging results that were available during my care of the patient were reviewed by me and considered in my medical decision making (see chart for details).    MDM Rules/Calculators/A&P  Patient presenting with complaints of right shoulder pain that appears musculoskeletal in nature.  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  His x-rays show arthritic changes, however no other abnormality.  Patient feeling better after Toradol.  He will be discharged with NSAIDs, hydrocodone, and follow-up as needed.  Final Clinical Impression(s) / ED Diagnoses Final diagnoses:  None    Rx / DC Orders ED Discharge Orders    None       Veryl Speak, MD 01/23/20 [phone]

## 2020-01-23 NOTE — ED Triage Notes (Signed)
Pt reports right shoulder pain that radiates to tip of fingers x 2days. Pt reports he has been taking ASA

## 2020-01-25 DIAGNOSIS — M7520 Bicipital tendinitis, unspecified shoulder: Secondary | ICD-10-CM | POA: Diagnosis not present

## 2020-01-25 DIAGNOSIS — N401 Enlarged prostate with lower urinary tract symptoms: Secondary | ICD-10-CM | POA: Diagnosis not present

## 2020-01-25 DIAGNOSIS — I10 Essential (primary) hypertension: Secondary | ICD-10-CM | POA: Diagnosis not present

## 2020-02-07 ENCOUNTER — Ambulatory Visit: Payer: Medicare HMO | Admitting: Orthopedic Surgery

## 2020-02-07 ENCOUNTER — Ambulatory Visit: Payer: Medicare HMO

## 2020-02-07 ENCOUNTER — Encounter: Payer: Self-pay | Admitting: Orthopedic Surgery

## 2020-02-07 ENCOUNTER — Other Ambulatory Visit: Payer: Self-pay

## 2020-02-07 VITALS — BP 133/85 | HR 61 | Temp 97.2°F | Ht 70.0 in | Wt 170.0 lb

## 2020-02-07 DIAGNOSIS — M542 Cervicalgia: Secondary | ICD-10-CM

## 2020-02-07 NOTE — Progress Notes (Signed)
NEW PROBLEM//OFFICE VISIT  Chief Complaint  Patient presents with  . Shoulder Pain    Right shoulder pain, no injury.    79 year old male presented to the emergency room with severe pain in his right arm.  This is associated with weakness in his right hand trouble writing trouble dorsiflexing his right wrist and trouble with grip he denies any trouble walking  He does note that he is having trouble holding his golf club as well   Review of Systems  Constitutional: Negative for chills and fever.  Neurological: Positive for tingling and weakness.     Past Medical History:  Diagnosis Date  . Glaucoma   . Hypertension   . Sleep apnea     Past Surgical History:  Procedure Laterality Date  . COLONOSCOPY    . COLONOSCOPY N/A 10/16/2015   Dr. Oneida Alar: multiple colon polyps, tubular adenomas (three were 6-12 mm). Early interval due in 2018   . COLONOSCOPY N/A 11/08/2016   Procedure: COLONOSCOPY;  Surgeon: Danie Binder, MD;  Location: [facility];  Service: Endoscopy;  Laterality: N/A;  1:00 pm    Family History  Problem Relation Age of Onset  . Diabetes Mother   . Heart Problems Father   . Diabetes Brother   . Colon cancer Neg Hx    Social History   Tobacco Use  . Smoking status: Never Smoker  . Smokeless tobacco: Never Used  Substance Use Topics  . Alcohol use: Not Currently    Comment: a beer every now and then  . Drug use: No    No Known Allergies  Current Meds  Medication Sig  . bimatoprost (LUMIGAN) 0.01 % SOLN Place 1 drop into both eyes at bedtime.   . COMBIGAN 0.2-0.5 % ophthalmic solution Place 1 drop into both eyes 2 (two) times daily.  Marland Kitchen doxazosin (CARDURA) 8 MG tablet Take 8 mg by mouth at bedtime.  Marland Kitchen HYDROcodone-acetaminophen (NORCO) 5-325 MG tablet Take 1-2 tablets by mouth every 6 (six) hours as needed.  Marland Kitchen losartan-hydrochlorothiazide (HYZAAR) 100-12.5 MG tablet Take 1 tablet by mouth daily.  . naproxen (NAPROSYN) 375 MG tablet Take 1 tablet (375 mg  total) by mouth 2 (two) times daily.    BP 133/85   Pulse 61   Temp (!) 97.2 F (36.2 C)   Ht 5\' 10"  (1.778 m)   Wt 170 lb (77.1 kg)   BMI 24.39 kg/m   Physical Exam Patient has normal body weight if not a small frame is ectomorphic.  He is oriented x3 his mood is pleasant his affect is normal no gait disturbance Ortho Exam   His exam shows normal range of motion of both shoulders.  He has tenderness at the bottom of the cervical spine and a positive Spurling sign although left to right motion flexion extension did not reproduce any symptoms but at right rotation and extension did  He has atrophy and weakness in his right hand normal biceps and triceps reflex  Pulses are good  No adenopathy was seen   MEDICAL DECISION MAKING  A. No diagnosis found.  B. DATA ANALYSED:    IMAGING: Independent interpretation of images: X-rays were obtained on January 23, 2020 from an outside facility at [facility] and we see degenerative arthritis of the acromioclavicular joint with a type III acromion and arthritis in the glenohumeral joint although the space looks pretty normal there might be an osteophyte inferior aspect of the humeral head and proximal shaft  Our internal  x-rays showed a complete ossification of the anterior part of the cervical spine   Orders: MRI cervical spine  Outside records reviewed: no  C. MANAGEMENT   Recommend MRI cervical spine probable C8 nerve impingement and or cervical myelopathy with radiation and weakness  No orders of the defined types were placed in this encounter.     Arther Abbott, MD  02/07/2020 10:56 AM

## 2020-02-07 NOTE — Addendum Note (Signed)
Addended byCandice Camp on: 02/07/2020 11:34 AM   Modules accepted: Orders

## 2020-02-24 DIAGNOSIS — H409 Unspecified glaucoma: Secondary | ICD-10-CM | POA: Diagnosis not present

## 2020-02-24 DIAGNOSIS — I10 Essential (primary) hypertension: Secondary | ICD-10-CM | POA: Diagnosis not present

## 2020-03-01 ENCOUNTER — Ambulatory Visit (HOSPITAL_COMMUNITY): Payer: Medicare HMO

## 2020-03-07 ENCOUNTER — Ambulatory Visit (HOSPITAL_COMMUNITY): Payer: Medicare HMO

## 2020-03-09 DIAGNOSIS — I10 Essential (primary) hypertension: Secondary | ICD-10-CM | POA: Diagnosis not present

## 2020-03-09 DIAGNOSIS — H409 Unspecified glaucoma: Secondary | ICD-10-CM | POA: Diagnosis not present

## 2020-03-09 DIAGNOSIS — N401 Enlarged prostate with lower urinary tract symptoms: Secondary | ICD-10-CM | POA: Diagnosis not present

## 2020-03-14 ENCOUNTER — Other Ambulatory Visit: Payer: Self-pay

## 2020-03-14 ENCOUNTER — Ambulatory Visit (HOSPITAL_COMMUNITY)
Admission: RE | Admit: 2020-03-14 | Discharge: 2020-03-14 | Disposition: A | Discharge status: Home / Self Care | Payer: Medicare HMO | Source: Ambulatory Visit | Attending: Orthopedic Surgery | Admitting: Orthopedic Surgery

## 2020-03-14 DIAGNOSIS — M542 Cervicalgia: Secondary | ICD-10-CM | POA: Insufficient documentation

## 2020-04-03 ENCOUNTER — Telehealth: Payer: Self-pay | Admitting: Orthopedic Surgery

## 2020-04-03 NOTE — Telephone Encounter (Signed)
Patient called and leave message to call Amy  Patient has cervical myelopathy and needs neurosurgical appointment  I asked him to call the office back as for Amy and then Amy can go ahead and refer him to neurosurgery

## 2020-04-08 DIAGNOSIS — I10 Essential (primary) hypertension: Secondary | ICD-10-CM | POA: Diagnosis not present

## 2020-04-08 DIAGNOSIS — H409 Unspecified glaucoma: Secondary | ICD-10-CM | POA: Diagnosis not present

## 2020-04-18 DIAGNOSIS — H401133 Primary open-angle glaucoma, bilateral, severe stage: Secondary | ICD-10-CM | POA: Diagnosis not present

## 2020-05-09 DIAGNOSIS — I10 Essential (primary) hypertension: Secondary | ICD-10-CM | POA: Diagnosis not present

## 2020-05-09 DIAGNOSIS — H409 Unspecified glaucoma: Secondary | ICD-10-CM | POA: Diagnosis not present

## 2020-06-09 DIAGNOSIS — H409 Unspecified glaucoma: Secondary | ICD-10-CM | POA: Diagnosis not present

## 2020-06-09 DIAGNOSIS — I10 Essential (primary) hypertension: Secondary | ICD-10-CM | POA: Diagnosis not present

## 2020-07-09 DIAGNOSIS — I10 Essential (primary) hypertension: Secondary | ICD-10-CM | POA: Diagnosis not present

## 2020-07-09 DIAGNOSIS — H409 Unspecified glaucoma: Secondary | ICD-10-CM | POA: Diagnosis not present

## 2020-07-12 DIAGNOSIS — Z23 Encounter for immunization: Secondary | ICD-10-CM | POA: Diagnosis not present

## 2020-08-09 DIAGNOSIS — I10 Essential (primary) hypertension: Secondary | ICD-10-CM | POA: Diagnosis not present

## 2020-08-09 DIAGNOSIS — H409 Unspecified glaucoma: Secondary | ICD-10-CM | POA: Diagnosis not present

## 2020-08-22 DIAGNOSIS — H401133 Primary open-angle glaucoma, bilateral, severe stage: Secondary | ICD-10-CM | POA: Diagnosis not present

## 2020-08-22 DIAGNOSIS — H26493 Other secondary cataract, bilateral: Secondary | ICD-10-CM | POA: Diagnosis not present

## 2020-09-12 DIAGNOSIS — N401 Enlarged prostate with lower urinary tract symptoms: Secondary | ICD-10-CM | POA: Diagnosis not present

## 2020-09-12 DIAGNOSIS — Z0001 Encounter for general adult medical examination with abnormal findings: Secondary | ICD-10-CM | POA: Diagnosis not present

## 2020-09-12 DIAGNOSIS — Z1331 Encounter for screening for depression: Secondary | ICD-10-CM | POA: Diagnosis not present

## 2020-09-12 DIAGNOSIS — I10 Essential (primary) hypertension: Secondary | ICD-10-CM | POA: Diagnosis not present

## 2020-09-12 DIAGNOSIS — H409 Unspecified glaucoma: Secondary | ICD-10-CM | POA: Diagnosis not present

## 2020-09-12 DIAGNOSIS — Z1389 Encounter for screening for other disorder: Secondary | ICD-10-CM | POA: Diagnosis not present

## 2020-09-14 DIAGNOSIS — I10 Essential (primary) hypertension: Secondary | ICD-10-CM | POA: Diagnosis not present

## 2020-09-14 DIAGNOSIS — H409 Unspecified glaucoma: Secondary | ICD-10-CM | POA: Diagnosis not present

## 2020-09-14 DIAGNOSIS — Z0001 Encounter for general adult medical examination with abnormal findings: Secondary | ICD-10-CM | POA: Diagnosis not present

## 2020-10-13 DIAGNOSIS — I10 Essential (primary) hypertension: Secondary | ICD-10-CM | POA: Diagnosis not present

## 2020-10-13 DIAGNOSIS — N401 Enlarged prostate with lower urinary tract symptoms: Secondary | ICD-10-CM | POA: Diagnosis not present

## 2020-11-13 DIAGNOSIS — I10 Essential (primary) hypertension: Secondary | ICD-10-CM | POA: Diagnosis not present

## 2020-11-13 DIAGNOSIS — H409 Unspecified glaucoma: Secondary | ICD-10-CM | POA: Diagnosis not present

## 2020-12-11 DIAGNOSIS — H409 Unspecified glaucoma: Secondary | ICD-10-CM | POA: Diagnosis not present

## 2020-12-11 DIAGNOSIS — I10 Essential (primary) hypertension: Secondary | ICD-10-CM | POA: Diagnosis not present

## 2021-01-05 DIAGNOSIS — H401133 Primary open-angle glaucoma, bilateral, severe stage: Secondary | ICD-10-CM | POA: Diagnosis not present

## 2021-01-11 DIAGNOSIS — H409 Unspecified glaucoma: Secondary | ICD-10-CM | POA: Diagnosis not present

## 2021-01-11 DIAGNOSIS — I10 Essential (primary) hypertension: Secondary | ICD-10-CM | POA: Diagnosis not present

## 2021-02-11 DIAGNOSIS — H409 Unspecified glaucoma: Secondary | ICD-10-CM | POA: Diagnosis not present

## 2021-02-11 DIAGNOSIS — I10 Essential (primary) hypertension: Secondary | ICD-10-CM | POA: Diagnosis not present

## 2021-03-20 DIAGNOSIS — N401 Enlarged prostate with lower urinary tract symptoms: Secondary | ICD-10-CM | POA: Diagnosis not present

## 2021-03-20 DIAGNOSIS — H409 Unspecified glaucoma: Secondary | ICD-10-CM | POA: Diagnosis not present

## 2021-03-20 DIAGNOSIS — I10 Essential (primary) hypertension: Secondary | ICD-10-CM | POA: Diagnosis not present

## 2021-04-19 DIAGNOSIS — H409 Unspecified glaucoma: Secondary | ICD-10-CM | POA: Diagnosis not present

## 2021-04-19 DIAGNOSIS — I10 Essential (primary) hypertension: Secondary | ICD-10-CM | POA: Diagnosis not present

## 2021-05-01 DIAGNOSIS — Z20822 Contact with and (suspected) exposure to covid-19: Secondary | ICD-10-CM | POA: Diagnosis not present

## 2021-05-08 DIAGNOSIS — H401123 Primary open-angle glaucoma, left eye, severe stage: Secondary | ICD-10-CM | POA: Diagnosis not present

## 2021-05-08 DIAGNOSIS — H401111 Primary open-angle glaucoma, right eye, mild stage: Secondary | ICD-10-CM | POA: Diagnosis not present

## 2021-05-14 DIAGNOSIS — Z20822 Contact with and (suspected) exposure to covid-19: Secondary | ICD-10-CM | POA: Diagnosis not present

## 2021-05-20 DIAGNOSIS — N401 Enlarged prostate with lower urinary tract symptoms: Secondary | ICD-10-CM | POA: Diagnosis not present

## 2021-05-20 DIAGNOSIS — I10 Essential (primary) hypertension: Secondary | ICD-10-CM | POA: Diagnosis not present

## 2021-06-05 IMAGING — DX DG SHOULDER 2+V*R*
2 series · 2 of 2 positions shown · non-contrast
Comparison: None.

CLINICAL DATA: Acute right shoulder pain without known injury.

EXAM:
RIGHT SHOULDER - 2+ VIEW

[shoulder axial]
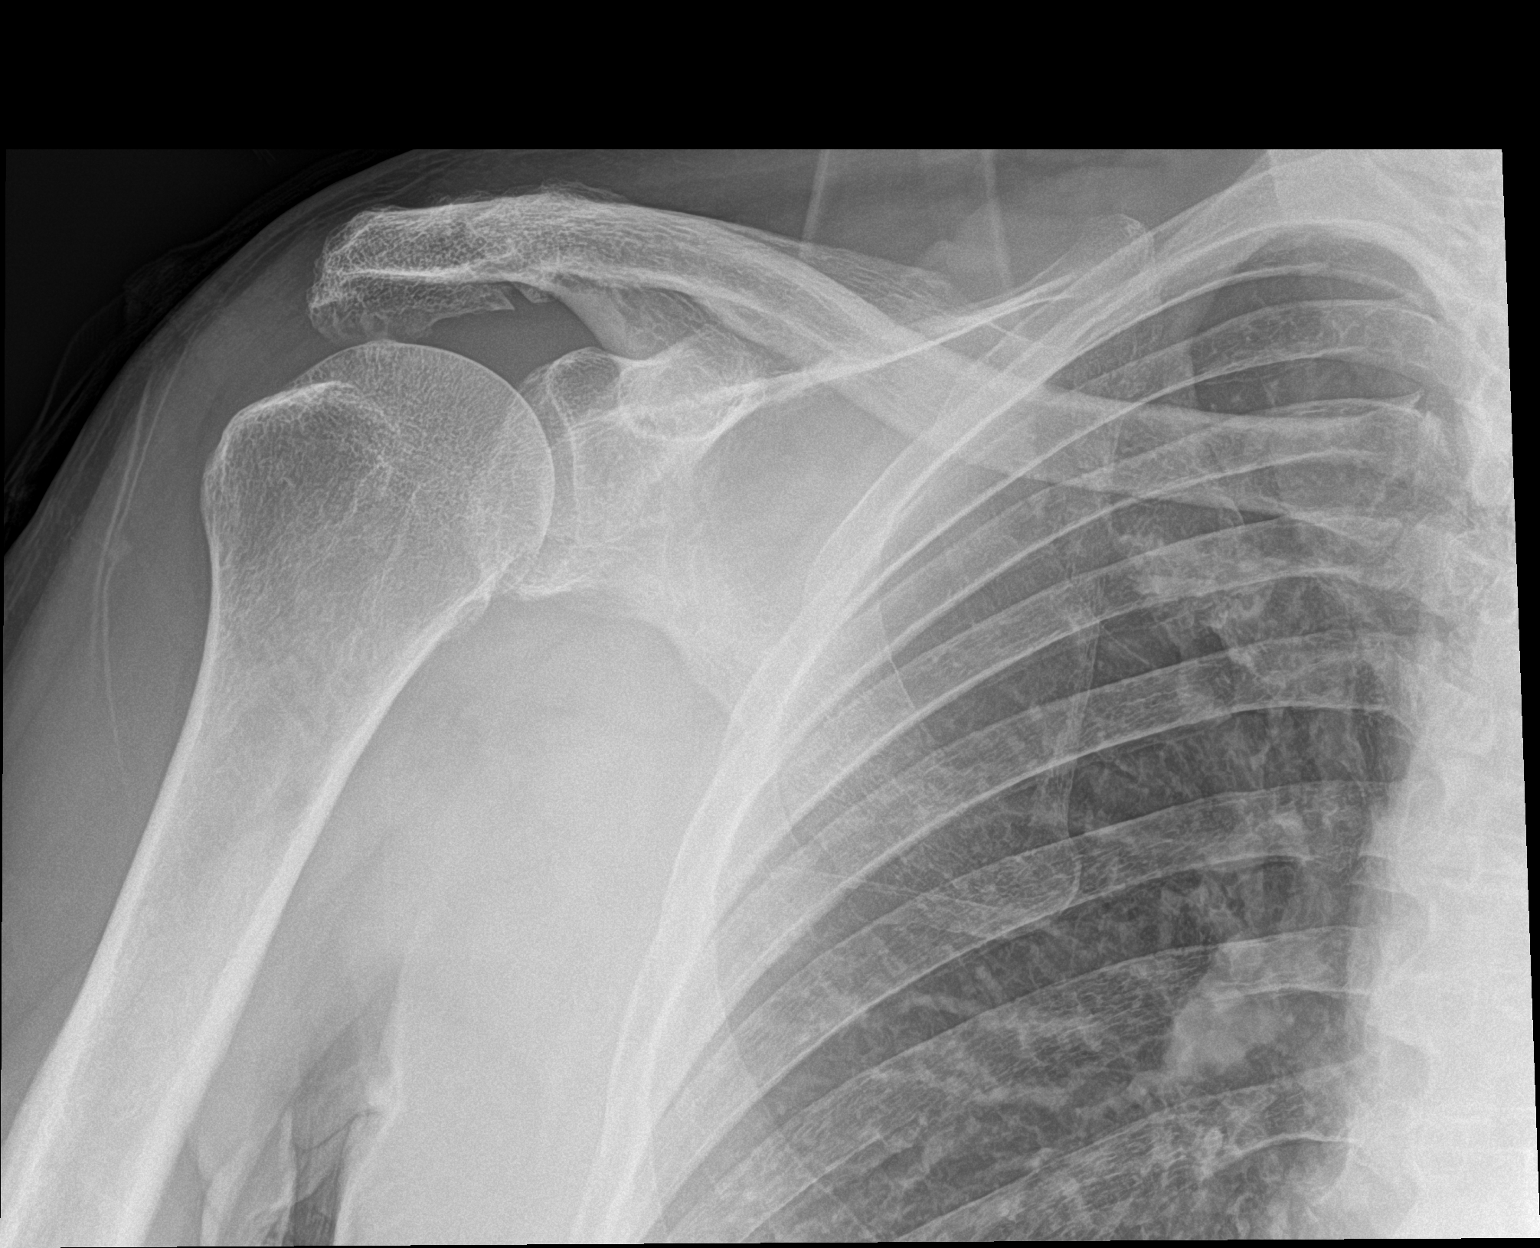

[shoulder swimmer]
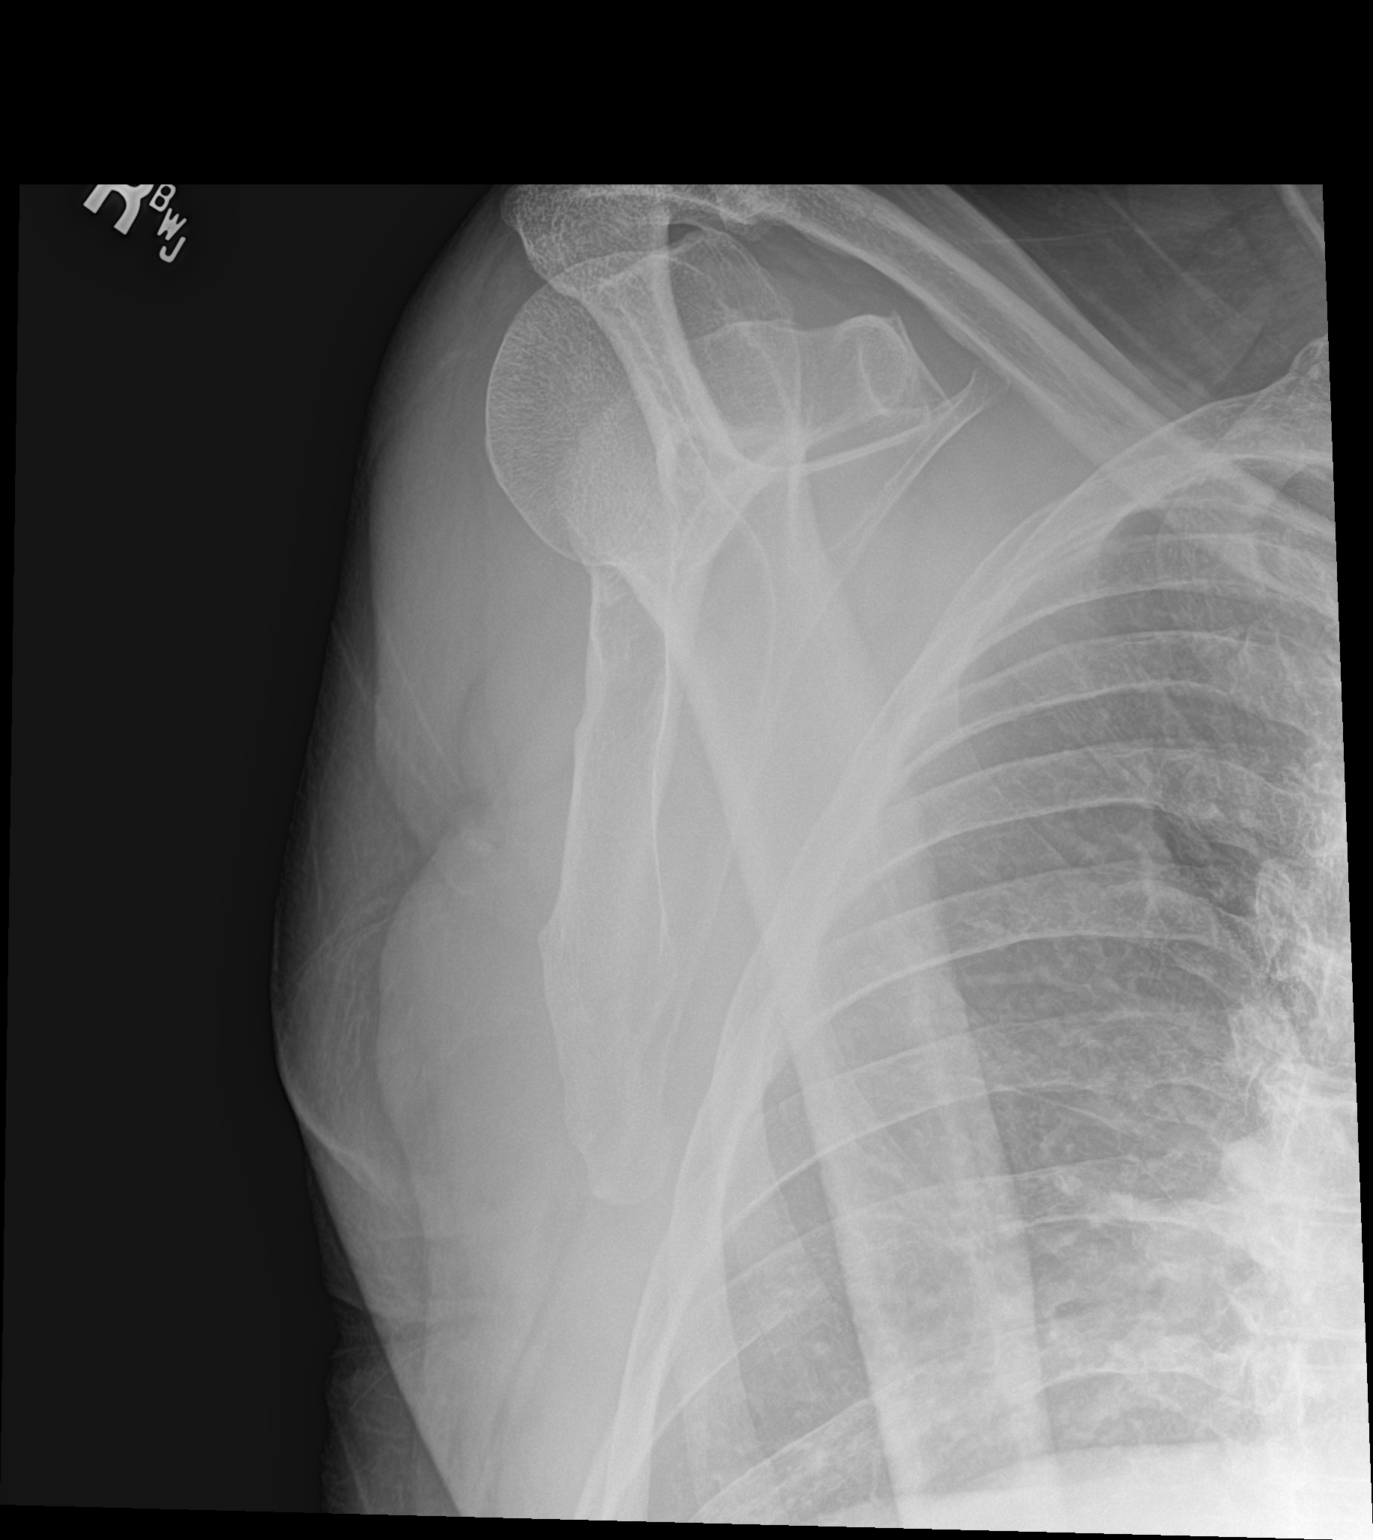

[2 of 2 positions shown; findings below may reference images not displayed]

FINDINGS: There is no evidence of fracture or dislocation. Mild degenerative
changes seen involving the right acromioclavicular joint with mild
acromial spurring. Soft tissues are unremarkable.
IMPRESSION: Mild degenerative joint disease of the right acromioclavicular
joint. No acute abnormality seen in the right shoulder.

## 2021-06-20 DIAGNOSIS — H409 Unspecified glaucoma: Secondary | ICD-10-CM | POA: Diagnosis not present

## 2021-06-20 DIAGNOSIS — I10 Essential (primary) hypertension: Secondary | ICD-10-CM | POA: Diagnosis not present

## 2021-07-17 DIAGNOSIS — Z23 Encounter for immunization: Secondary | ICD-10-CM | POA: Diagnosis not present

## 2021-07-20 DIAGNOSIS — M13861 Other specified arthritis, right knee: Secondary | ICD-10-CM | POA: Diagnosis not present

## 2021-07-20 DIAGNOSIS — I10 Essential (primary) hypertension: Secondary | ICD-10-CM | POA: Diagnosis not present

## 2021-08-20 DIAGNOSIS — H409 Unspecified glaucoma: Secondary | ICD-10-CM | POA: Diagnosis not present

## 2021-08-20 DIAGNOSIS — I10 Essential (primary) hypertension: Secondary | ICD-10-CM | POA: Diagnosis not present

## 2021-09-10 DIAGNOSIS — Z1331 Encounter for screening for depression: Secondary | ICD-10-CM | POA: Diagnosis not present

## 2021-09-10 DIAGNOSIS — N401 Enlarged prostate with lower urinary tract symptoms: Secondary | ICD-10-CM | POA: Diagnosis not present

## 2021-09-10 DIAGNOSIS — I10 Essential (primary) hypertension: Secondary | ICD-10-CM | POA: Diagnosis not present

## 2021-09-10 DIAGNOSIS — Z1389 Encounter for screening for other disorder: Secondary | ICD-10-CM | POA: Diagnosis not present

## 2021-09-10 DIAGNOSIS — H409 Unspecified glaucoma: Secondary | ICD-10-CM | POA: Diagnosis not present

## 2021-09-10 DIAGNOSIS — Z0001 Encounter for general adult medical examination with abnormal findings: Secondary | ICD-10-CM | POA: Diagnosis not present

## 2021-09-11 DIAGNOSIS — Z0001 Encounter for general adult medical examination with abnormal findings: Secondary | ICD-10-CM | POA: Diagnosis not present

## 2021-09-11 DIAGNOSIS — I10 Essential (primary) hypertension: Secondary | ICD-10-CM | POA: Diagnosis not present

## 2021-09-18 DIAGNOSIS — H26491 Other secondary cataract, right eye: Secondary | ICD-10-CM | POA: Diagnosis not present

## 2021-09-18 DIAGNOSIS — H401111 Primary open-angle glaucoma, right eye, mild stage: Secondary | ICD-10-CM | POA: Diagnosis not present

## 2021-09-18 DIAGNOSIS — H401123 Primary open-angle glaucoma, left eye, severe stage: Secondary | ICD-10-CM | POA: Diagnosis not present

## 2021-09-18 DIAGNOSIS — Z961 Presence of intraocular lens: Secondary | ICD-10-CM | POA: Diagnosis not present

## 2021-10-11 DIAGNOSIS — H409 Unspecified glaucoma: Secondary | ICD-10-CM | POA: Diagnosis not present

## 2021-10-11 DIAGNOSIS — I10 Essential (primary) hypertension: Secondary | ICD-10-CM | POA: Diagnosis not present

## 2021-11-11 DIAGNOSIS — I10 Essential (primary) hypertension: Secondary | ICD-10-CM | POA: Diagnosis not present

## 2021-11-11 DIAGNOSIS — N401 Enlarged prostate with lower urinary tract symptoms: Secondary | ICD-10-CM | POA: Diagnosis not present

## 2021-12-09 DIAGNOSIS — M13861 Other specified arthritis, right knee: Secondary | ICD-10-CM | POA: Diagnosis not present

## 2021-12-09 DIAGNOSIS — I10 Essential (primary) hypertension: Secondary | ICD-10-CM | POA: Diagnosis not present

## 2022-01-08 DIAGNOSIS — H401123 Primary open-angle glaucoma, left eye, severe stage: Secondary | ICD-10-CM | POA: Diagnosis not present

## 2022-01-08 DIAGNOSIS — H401111 Primary open-angle glaucoma, right eye, mild stage: Secondary | ICD-10-CM | POA: Diagnosis not present

## 2022-01-08 DIAGNOSIS — H35033 Hypertensive retinopathy, bilateral: Secondary | ICD-10-CM | POA: Diagnosis not present

## 2022-01-09 DIAGNOSIS — I10 Essential (primary) hypertension: Secondary | ICD-10-CM | POA: Diagnosis not present

## 2022-01-09 DIAGNOSIS — M13861 Other specified arthritis, right knee: Secondary | ICD-10-CM | POA: Diagnosis not present

## 2022-02-08 DIAGNOSIS — H409 Unspecified glaucoma: Secondary | ICD-10-CM | POA: Diagnosis not present

## 2022-02-08 DIAGNOSIS — I10 Essential (primary) hypertension: Secondary | ICD-10-CM | POA: Diagnosis not present

## 2022-03-11 DIAGNOSIS — I10 Essential (primary) hypertension: Secondary | ICD-10-CM | POA: Diagnosis not present

## 2022-03-11 DIAGNOSIS — M13861 Other specified arthritis, right knee: Secondary | ICD-10-CM | POA: Diagnosis not present

## 2022-03-11 DIAGNOSIS — N401 Enlarged prostate with lower urinary tract symptoms: Secondary | ICD-10-CM | POA: Diagnosis not present

## 2022-03-11 DIAGNOSIS — H409 Unspecified glaucoma: Secondary | ICD-10-CM | POA: Diagnosis not present

## 2022-04-10 DIAGNOSIS — I10 Essential (primary) hypertension: Secondary | ICD-10-CM | POA: Diagnosis not present

## 2022-04-10 DIAGNOSIS — H409 Unspecified glaucoma: Secondary | ICD-10-CM | POA: Diagnosis not present

## 2022-05-11 DIAGNOSIS — H409 Unspecified glaucoma: Secondary | ICD-10-CM | POA: Diagnosis not present

## 2022-05-11 DIAGNOSIS — I10 Essential (primary) hypertension: Secondary | ICD-10-CM | POA: Diagnosis not present

## 2022-05-14 DIAGNOSIS — H26491 Other secondary cataract, right eye: Secondary | ICD-10-CM | POA: Diagnosis not present

## 2022-05-14 DIAGNOSIS — H35033 Hypertensive retinopathy, bilateral: Secondary | ICD-10-CM | POA: Diagnosis not present

## 2022-05-14 DIAGNOSIS — H401123 Primary open-angle glaucoma, left eye, severe stage: Secondary | ICD-10-CM | POA: Diagnosis not present

## 2022-05-14 DIAGNOSIS — H401111 Primary open-angle glaucoma, right eye, mild stage: Secondary | ICD-10-CM | POA: Diagnosis not present

## 2022-06-11 DIAGNOSIS — H409 Unspecified glaucoma: Secondary | ICD-10-CM | POA: Diagnosis not present

## 2022-06-11 DIAGNOSIS — I10 Essential (primary) hypertension: Secondary | ICD-10-CM | POA: Diagnosis not present

## 2022-07-11 DIAGNOSIS — I10 Essential (primary) hypertension: Secondary | ICD-10-CM | POA: Diagnosis not present

## 2022-07-11 DIAGNOSIS — H409 Unspecified glaucoma: Secondary | ICD-10-CM | POA: Diagnosis not present

## 2022-07-22 DIAGNOSIS — H401111 Primary open-angle glaucoma, right eye, mild stage: Secondary | ICD-10-CM | POA: Diagnosis not present

## 2022-07-22 DIAGNOSIS — H26491 Other secondary cataract, right eye: Secondary | ICD-10-CM | POA: Diagnosis not present

## 2022-07-22 DIAGNOSIS — H35033 Hypertensive retinopathy, bilateral: Secondary | ICD-10-CM | POA: Diagnosis not present

## 2022-07-22 DIAGNOSIS — H401123 Primary open-angle glaucoma, left eye, severe stage: Secondary | ICD-10-CM | POA: Diagnosis not present

## 2022-08-11 DIAGNOSIS — H409 Unspecified glaucoma: Secondary | ICD-10-CM | POA: Diagnosis not present

## 2022-08-11 DIAGNOSIS — I10 Essential (primary) hypertension: Secondary | ICD-10-CM | POA: Diagnosis not present

## 2022-09-02 DIAGNOSIS — I10 Essential (primary) hypertension: Secondary | ICD-10-CM | POA: Diagnosis not present

## 2022-09-02 DIAGNOSIS — Z1159 Encounter for screening for other viral diseases: Secondary | ICD-10-CM | POA: Diagnosis not present

## 2022-09-02 DIAGNOSIS — Z0001 Encounter for general adult medical examination with abnormal findings: Secondary | ICD-10-CM | POA: Diagnosis not present

## 2022-09-10 DIAGNOSIS — Z1331 Encounter for screening for depression: Secondary | ICD-10-CM | POA: Diagnosis not present

## 2022-09-10 DIAGNOSIS — I10 Essential (primary) hypertension: Secondary | ICD-10-CM | POA: Diagnosis not present

## 2022-09-10 DIAGNOSIS — Z1389 Encounter for screening for other disorder: Secondary | ICD-10-CM | POA: Diagnosis not present

## 2022-09-10 DIAGNOSIS — Z0001 Encounter for general adult medical examination with abnormal findings: Secondary | ICD-10-CM | POA: Diagnosis not present

## 2022-09-10 DIAGNOSIS — M13861 Other specified arthritis, right knee: Secondary | ICD-10-CM | POA: Diagnosis not present

## 2022-09-10 DIAGNOSIS — N401 Enlarged prostate with lower urinary tract symptoms: Secondary | ICD-10-CM | POA: Diagnosis not present

## 2022-09-10 DIAGNOSIS — H409 Unspecified glaucoma: Secondary | ICD-10-CM | POA: Diagnosis not present

## 2022-09-17 ENCOUNTER — Emergency Department (HOSPITAL_COMMUNITY)
Admission: EM | Admit: 2022-09-17 | Discharge: 2022-09-18 | Disposition: A | Discharge status: Home / Self Care | Payer: Medicare HMO | Attending: Emergency Medicine | Admitting: Emergency Medicine

## 2022-09-17 ENCOUNTER — Emergency Department (HOSPITAL_COMMUNITY): Payer: Medicare HMO

## 2022-09-17 ENCOUNTER — Encounter (HOSPITAL_COMMUNITY): Payer: Self-pay

## 2022-09-17 DIAGNOSIS — M25552 Pain in left hip: Secondary | ICD-10-CM | POA: Diagnosis not present

## 2022-09-17 DIAGNOSIS — R109 Unspecified abdominal pain: Secondary | ICD-10-CM | POA: Insufficient documentation

## 2022-09-17 DIAGNOSIS — K449 Diaphragmatic hernia without obstruction or gangrene: Secondary | ICD-10-CM | POA: Diagnosis not present

## 2022-09-17 DIAGNOSIS — N401 Enlarged prostate with lower urinary tract symptoms: Secondary | ICD-10-CM | POA: Diagnosis not present

## 2022-09-17 DIAGNOSIS — M545 Low back pain, unspecified: Secondary | ICD-10-CM | POA: Diagnosis not present

## 2022-09-17 DIAGNOSIS — K4090 Unilateral inguinal hernia, without obstruction or gangrene, not specified as recurrent: Secondary | ICD-10-CM | POA: Diagnosis not present

## 2022-09-17 DIAGNOSIS — K5900 Constipation, unspecified: Secondary | ICD-10-CM | POA: Diagnosis not present

## 2022-09-17 LAB — URINALYSIS, ROUTINE W REFLEX MICROSCOPIC
Bacteria, UA: NONE SEEN
Bilirubin Urine: NEGATIVE
Glucose, UA: NEGATIVE mg/dL
Hgb urine dipstick: NEGATIVE
Ketones, ur: 20 mg/dL — AB
Nitrite: NEGATIVE
Protein, ur: NEGATIVE mg/dL
Specific Gravity, Urine: 1.019 (ref 1.005–1.030)
pH: 5 (ref 5.0–8.0)

## 2022-09-17 MED ORDER — OXYCODONE-ACETAMINOPHEN 5-325 MG PO TABS: 0.5000 | ORAL_TABLET | Freq: Four times a day (QID) | ORAL | 0 refills | Status: AC | PRN

## 2022-09-17 MED ORDER — OXYCODONE-ACETAMINOPHEN 5-325 MG PO TABS
1.0000 | ORAL_TABLET | Freq: Once | ORAL | Status: AC
  Administered 2022-09-17: 1 via ORAL
  Filled 2022-09-17: qty 1

## 2022-09-17 NOTE — ED Notes (Signed)
Patient transported to CT 

## 2022-09-17 NOTE — ED Provider Notes (Incomplete)
[facility] EMERGENCY DEPARTMENT Provider Note   CSN: [id number] Arrival date & time: 09/17/22  1655     History {Add pertinent medical, surgical, social history, OB history to HPI:1} Chief Complaint  Patient presents with  . Hip Pain    Tracy Davenport is a 81 y.o. male With history including tension, sleep apnea, reported kidney stone approximately 6 years ago which passed without intervention presenting for evaluation of left flank and low back pain which started yesterday morning.  He describes an aching sensation in his lower back which radiates into his left lower abdominal region and has been associated with increased urinary frequency.  He denies scrotal or penile pain.  He denies hematuria or cloudy urine, he is concerned about a possible UTI however.  He states his symptoms do not remind him of his last kidney stone.  He has had no nausea or vomiting, fevers or chills.  He has taken Tylenol without significant improvement in his symptoms.  The history is provided by the patient.       Home Medications Prior to Admission medications   Medication Sig Start Date End Date Taking? Authorizing Provider  bimatoprost (LUMIGAN) 0.01 % SOLN Place 1 drop into both eyes at bedtime.     [provider]  COMBIGAN 0.2-0.5 % ophthalmic solution Place 1 drop into both eyes 2 (two) times daily. 09/07/15   [provider]  doxazosin (CARDURA) 8 MG tablet Take 8 mg by mouth at bedtime.    [provider]  HYDROcodone-acetaminophen (NORCO) 5-325 MG tablet Take 1-2 tablets by mouth every 6 (six) hours as needed. 01/23/20   Veryl Speak, MD  losartan-hydrochlorothiazide (HYZAAR) 100-12.5 MG tablet Take 1 tablet by mouth daily. 07/31/15   [provider]  naproxen (NAPROSYN) 375 MG tablet Take 1 tablet (375 mg total) by mouth 2 (two) times daily. 01/23/20   Veryl Speak, MD      Allergies    Patient has no known allergies.    Review of Systems   Review of  Systems  Constitutional:  Negative for chills and fever.  HENT:  Negative for congestion and sore throat.   Eyes: Negative.   Respiratory:  Negative for chest tightness and shortness of breath.   Cardiovascular:  Negative for chest pain.  Gastrointestinal:  Negative for abdominal pain, nausea and vomiting.  Genitourinary:  Positive for dysuria, flank pain, frequency and urgency. Negative for hematuria.  Musculoskeletal:  Negative for arthralgias, joint swelling and neck pain.  Skin: Negative.  Negative for rash and wound.  Neurological:  Negative for dizziness, weakness, light-headedness, numbness and headaches.  Psychiatric/Behavioral: Negative.      Physical Exam Updated Vital Signs BP 121/71 (BP Location: Left Arm)   Pulse 92   Temp 97.9 F (36.6 C) (Oral)   Resp 14   Ht '5\' 10"'$  (1.778 m)   Wt 78.5 kg   SpO2 99%   BMI 24.82 kg/m  Physical Exam Vitals and nursing note reviewed.  Constitutional:      Appearance: He is well-developed.  HENT:     Head: Normocephalic and atraumatic.  Eyes:     Conjunctiva/sclera: Conjunctivae normal.  Cardiovascular:     Rate and Rhythm: Normal rate and regular rhythm.     Heart sounds: Normal heart sounds.  Pulmonary:     Effort: Pulmonary effort is normal.     Breath sounds: Normal breath sounds. No wheezing.  Abdominal:     General: Bowel sounds are normal.  Palpations: Abdomen is soft.     Tenderness: There is no abdominal tenderness. There is no guarding.  Musculoskeletal:        General: Normal range of motion.     Cervical back: Normal range of motion.     Lumbar back: Normal. No bony tenderness.       Back:  Skin:    General: Skin is warm and dry.  Neurological:     Mental Status: He is alert.     ED Results / Procedures / Treatments   Labs (all labs ordered are listed, but only abnormal results are displayed) Labs Reviewed  URINALYSIS, ROUTINE W REFLEX MICROSCOPIC - Abnormal; Notable for the following components:       Result Value   Ketones, ur 20 (*)    Leukocytes,Ua TRACE (*)    All other components within normal limits    EKG None  Radiology CT Renal Stone Study  Result Date: 09/17/2022 CLINICAL DATA:  Abdominal and flank pain. Stone suspected. Left hip pain. EXAM: CT ABDOMEN AND PELVIS WITHOUT CONTRAST TECHNIQUE: Multidetector CT imaging of the abdomen and pelvis was performed following the standard protocol without IV contrast. RADIATION DOSE REDUCTION: This exam was performed according to the departmental dose-optimization program which includes automated exposure control, adjustment of the mA and/or kV according to patient size and/or use of iterative reconstruction technique. COMPARISON:  CT without contrast 07/16/2014. FINDINGS: Lower chest: Lung bases are clear with COPD and scarring changes and scattered thin walled air cysts. The cardiac size is normal. Small hiatal hernia. Hepatobiliary: The liver is unremarkable without contrast. Gallbladder and bile ducts are unremarkable. Pancreas: Unremarkable without contrast. Spleen: Homogeneous noncontrast attenuation no audible no splenomegaly. Adrenals/Urinary Tract: There is no adrenal mass. A parapelvic cyst in the left upper renal pole is again noted, larger than previously measuring 5.3 x 3.4 cm, Hounsfield density of 6.6. No follow-up imaging is recommended. The bilateral renal cortex is otherwise unremarkable without contrast. No urinary stone is seen and no ureteral stones or hydroureteronephrosis. A markedly enlarged prostate protrudes into the bladder base significantly, moderately effacing the posterior lumen. There is only mild bladder thickening associated with this. A posterior bladder lesion would be difficult to exclude but the observed abnormality is likely entirely due to the prostate protruding into the bladder from posteriorly, and furthermore this is similar in appearance to the previous exam. Stomach/Bowel: Small hiatal hernia.  Unremarkable stomach and unopacified small bowel. The appendix is normal. There is moderate stool retention. Colonic diverticulosis is noted without evidence of acute diverticulitis. Vascular/Lymphatic: There are no visible aortic plaques or AAA. Trace calcific plaque noted left common iliac artery. No adenopathy is seen. Reproductive: Marked prostatomegaly. Maximum measurements 5.7 x 6.0 x 9.2 cm with mild irregular contour and slight interval enlargement compared to the previous exam. Similar appearance of protrusion into the posterior bladder lumen. Seminal vesicles are normally outlined. Other: There are small umbilical and inguinal fat hernias. There is no free air, free hemorrhage or free fluid. Musculoskeletal: There is mild osteopenia and degenerative change of the lumbar spine. Ankylosis right SI joint with bridging osteophytes anterior left SI joint. Small sclerotic lesions are again noted and unchanged in left hemisacrum, right hemipelvis and both proximal femurs, with small scattered sclerotic lesions in the lumbar vertebra. IMPRESSION: 1. No acute noncontrast CT abnormality is seen. 2. No urinary stone or hydroureteronephrosis. 3. Marked prostatomegaly with slight interval enlargement compared with the prior study, and a prominent protrusion into the posterior bladder.  Urology consult recommended unless already done. A posterior bladder lesion would be difficult to exclude but the appearance is similar to 2015. 4. Mild bladder wall thickening which could be due to chronic outlet obstruction or cystitis. 5. Constipation and diverticulosis. 6. Small hiatal hernia. 7. Osteopenia and degenerative change. 8. Small sclerotic lesions in the left sacrum, pelvis, lumbar spine and proximal femurs, unchanged from the previous study. Likely due to multiple bone islands or pyknodysostosis, less likely metastatic lesions given lack of interval change. 9. Umbilical and inguinal fat hernias. Electronically Signed   By:  Telford Nab M.D.   On: 09/17/2022 22:32   DG Hip Unilat W or Wo Pelvis 2-3 Views Left  Result Date: 09/17/2022 CLINICAL DATA:  Left hip pain since yesterday EXAM: DG HIP (WITH OR WITHOUT PELVIS) 2-3V LEFT COMPARISON:  None Available. FINDINGS: Frontal view of the pelvis as well as frontal and frogleg lateral views of the left hip are obtained on 3 images. No acute fracture, subluxation, or dislocation. Symmetrical bilateral hip osteoarthritis. Sacroiliac joints are unremarkable. Prominent facet hypertrophy at the lumbosacral junction. IMPRESSION: 1. Degenerative changes of the lower lumbar spine and bilateral hips. No acute bony abnormality. Electronically Signed   By: Randa Ngo M.D.   On: 09/17/2022 18:16    Procedures Procedures  {Document cardiac monitor, telemetry assessment procedure when appropriate:1}  Medications Ordered in ED Medications  oxyCODONE-acetaminophen (PERCOCET/ROXICET) 5-325 MG per tablet 1 tablet (1 tablet Oral Given 09/17/22 2140)    ED Course/ Medical Decision Making/ A&P                           Medical Decision Making Amount and/or Complexity of Data Reviewed Labs: ordered. Radiology: ordered.  Risk Prescription drug management.     {Document critical care time when appropriate:1} {Document review of labs and clinical decision tools ie heart score, Chads2Vasc2 etc:1}  {Document your independent review of radiology images, and any outside records:1} {Document your discussion with family members, caretakers, and with consultants:1} {Document social determinants of health affecting pt's care:1} {Document your decision making why or why not admission, treatments were needed:1} Final Clinical Impression(s) / ED Diagnoses Final diagnoses:  None    Rx / DC Orders ED Discharge Orders     None

## 2022-09-17 NOTE — ED Triage Notes (Signed)
Pt c/o left hip pain since yesterday morning. Pt denies injury/trauma.

## 2022-09-17 NOTE — ED Notes (Signed)
Pt given ice water.

## 2022-09-17 NOTE — ED Provider Notes (Signed)
[facility] EMERGENCY DEPARTMENT Provider Note   CSN: [id number] Arrival date & time: 09/17/22  1655     History  Chief Complaint  Patient presents with   Hip Pain    Tracy Davenport is a 81 y.o. male With history including tension, sleep apnea, reported kidney stone approximately 6 years ago which passed without intervention presenting for evaluation of left flank and low back pain which started yesterday morning.  He describes an aching sensation in his lower back which radiates into his left lower abdominal region and has been associated with increased urinary frequency.  He denies scrotal or penile pain.  He denies hematuria or cloudy urine, he is concerned about a possible UTI however.  He states his symptoms do not remind him of his last kidney stone.  He has had no nausea or vomiting, fevers or chills.  He has taken Tylenol without significant improvement in his symptoms.  The history is provided by the patient.       Home Medications Prior to Admission medications   Medication Sig Start Date End Date Taking? Authorizing Provider  oxyCODONE-acetaminophen (PERCOCET/ROXICET) 5-325 MG tablet Take 0.5-1 tablets by mouth every 6 (six) hours as needed for severe pain. 09/17/22  Yes Klaira Pesci, Almyra Free, PA-C  bimatoprost (LUMIGAN) 0.01 % SOLN Place 1 drop into both eyes at bedtime.     [provider]  COMBIGAN 0.2-0.5 % ophthalmic solution Place 1 drop into both eyes 2 (two) times daily. 09/07/15   [provider]  doxazosin (CARDURA) 8 MG tablet Take 8 mg by mouth at bedtime.    [provider]  HYDROcodone-acetaminophen (NORCO) 5-325 MG tablet Take 1-2 tablets by mouth every 6 (six) hours as needed. 01/23/20   Veryl Speak, MD  losartan-hydrochlorothiazide (HYZAAR) 100-12.5 MG tablet Take 1 tablet by mouth daily. 07/31/15   [provider]  naproxen (NAPROSYN) 375 MG tablet Take 1 tablet (375 mg total) by mouth 2 (two) times daily. 01/23/20   Veryl Speak, MD      Allergies    Patient has no known allergies.    Review of Systems   Review of Systems  Constitutional:  Negative for chills and fever.  HENT:  Negative for congestion and sore throat.   Eyes: Negative.   Respiratory:  Negative for chest tightness and shortness of breath.   Cardiovascular:  Negative for chest pain.  Gastrointestinal:  Negative for abdominal pain, nausea and vomiting.  Genitourinary:  Positive for dysuria, flank pain, frequency and urgency. Negative for hematuria.  Musculoskeletal:  Negative for arthralgias, joint swelling and neck pain.  Skin: Negative.  Negative for rash and wound.  Neurological:  Negative for dizziness, weakness, light-headedness, numbness and headaches.  Psychiatric/Behavioral: Negative.      Physical Exam Updated Vital Signs BP 121/71 (BP Location: Left Arm)   Pulse 92   Temp 97.9 F (36.6 C) (Oral)   Resp 14   Ht '5\' 10"'$  (1.778 m)   Wt 78.5 kg   SpO2 99%   BMI 24.82 kg/m  Physical Exam Vitals and nursing note reviewed.  Constitutional:      Appearance: He is well-developed.  HENT:     Head: Normocephalic and atraumatic.  Eyes:     Conjunctiva/sclera: Conjunctivae normal.  Cardiovascular:     Rate and Rhythm: Normal rate and regular rhythm.     Heart sounds: Normal heart sounds.  Pulmonary:     Effort: Pulmonary effort is normal.     Breath sounds: Normal  breath sounds. No wheezing.  Abdominal:     General: Bowel sounds are normal.     Palpations: Abdomen is soft.     Tenderness: There is no abdominal tenderness. There is no guarding.  Musculoskeletal:        General: Normal range of motion.     Cervical back: Normal range of motion.     Lumbar back: Normal. No bony tenderness.       Back:  Skin:    General: Skin is warm and dry.  Neurological:     Mental Status: He is alert.     ED Results / Procedures / Treatments   Labs (all labs ordered are listed, but only abnormal results are displayed) Labs  Reviewed  URINE CULTURE - Abnormal; Notable for the following components:      Result Value   Culture   (*)    Value: 20,000 COLONIES/mL STAPHYLOCOCCUS EPIDERMIDIS SUSCEPTIBILITIES TO FOLLOW Performed at [facility], [place] [address], [place], [place] [address]    All other components within normal limits  URINALYSIS, ROUTINE W REFLEX MICROSCOPIC - Abnormal; Notable for the following components:   Ketones, ur 20 (*)    Leukocytes,Ua TRACE (*)    All other components within normal limits    EKG None  Radiology CT Renal Stone Study  Result Date: 09/17/2022 CLINICAL DATA:  Abdominal and flank pain. Stone suspected. Left hip pain. EXAM: CT ABDOMEN AND PELVIS WITHOUT CONTRAST TECHNIQUE: Multidetector CT imaging of the abdomen and pelvis was performed following the standard protocol without IV contrast. RADIATION DOSE REDUCTION: This exam was performed according to the departmental dose-optimization program which includes automated exposure control, adjustment of the mA and/or kV according to patient size and/or use of iterative reconstruction technique. COMPARISON:  CT without contrast 07/16/2014. FINDINGS: Lower chest: Lung bases are clear with COPD and scarring changes and scattered thin walled air cysts. The cardiac size is normal. Small hiatal hernia. Hepatobiliary: The liver is unremarkable without contrast. Gallbladder and bile ducts are unremarkable. Pancreas: Unremarkable without contrast. Spleen: Homogeneous noncontrast attenuation no audible no splenomegaly. Adrenals/Urinary Tract: There is no adrenal mass. A parapelvic cyst in the left upper renal pole is again noted, larger than previously measuring 5.3 x 3.4 cm, Hounsfield density of 6.6. No follow-up imaging is recommended. The bilateral renal cortex is otherwise unremarkable without contrast. No urinary stone is seen and no ureteral stones or hydroureteronephrosis. A markedly enlarged prostate protrudes into the bladder base  significantly, moderately effacing the posterior lumen. There is only mild bladder thickening associated with this. A posterior bladder lesion would be difficult to exclude but the observed abnormality is likely entirely due to the prostate protruding into the bladder from posteriorly, and furthermore this is similar in appearance to the previous exam. Stomach/Bowel: Small hiatal hernia. Unremarkable stomach and unopacified small bowel. The appendix is normal. There is moderate stool retention. Colonic diverticulosis is noted without evidence of acute diverticulitis. Vascular/Lymphatic: There are no visible aortic plaques or AAA. Trace calcific plaque noted left common iliac artery. No adenopathy is seen. Reproductive: Marked prostatomegaly. Maximum measurements 5.7 x 6.0 x 9.2 cm with mild irregular contour and slight interval enlargement compared to the previous exam. Similar appearance of protrusion into the posterior bladder lumen. Seminal vesicles are normally outlined. Other: There are small umbilical and inguinal fat hernias. There is no free air, free hemorrhage or free fluid. Musculoskeletal: There is mild osteopenia and degenerative change of the lumbar spine. Ankylosis right SI joint  with bridging osteophytes anterior left SI joint. Small sclerotic lesions are again noted and unchanged in left hemisacrum, right hemipelvis and both proximal femurs, with small scattered sclerotic lesions in the lumbar vertebra. IMPRESSION: 1. No acute noncontrast CT abnormality is seen. 2. No urinary stone or hydroureteronephrosis. 3. Marked prostatomegaly with slight interval enlargement compared with the prior study, and a prominent protrusion into the posterior bladder. Urology consult recommended unless already done. A posterior bladder lesion would be difficult to exclude but the appearance is similar to 2015. 4. Mild bladder wall thickening which could be due to chronic outlet obstruction or cystitis. 5. Constipation  and diverticulosis. 6. Small hiatal hernia. 7. Osteopenia and degenerative change. 8. Small sclerotic lesions in the left sacrum, pelvis, lumbar spine and proximal femurs, unchanged from the previous study. Likely due to multiple bone islands or pyknodysostosis, less likely metastatic lesions given lack of interval change. 9. Umbilical and inguinal fat hernias. Electronically Signed   By: Telford Nab M.D.   On: 09/17/2022 22:32   DG Hip Unilat W or Wo Pelvis 2-3 Views Left  Result Date: 09/17/2022 CLINICAL DATA:  Left hip pain since yesterday EXAM: DG HIP (WITH OR WITHOUT PELVIS) 2-3V LEFT COMPARISON:  None Available. FINDINGS: Frontal view of the pelvis as well as frontal and frogleg lateral views of the left hip are obtained on 3 images. No acute fracture, subluxation, or dislocation. Symmetrical bilateral hip osteoarthritis. Sacroiliac joints are unremarkable. Prominent facet hypertrophy at the lumbosacral junction. IMPRESSION: 1. Degenerative changes of the lower lumbar spine and bilateral hips. No acute bony abnormality. Electronically Signed   By: Randa Ngo M.D.   On: 09/17/2022 18:16    Procedures Procedures    Medications Ordered in ED Medications  oxyCODONE-acetaminophen (PERCOCET/ROXICET) 5-325 MG per tablet 1 tablet (1 tablet Oral Given 09/17/22 2140)    ED Course/ Medical Decision Making/ A&P                           Medical Decision Making Patient presenting with left flank/low back pain which started yesterday morning in association with increased urinary frequency.  Denies fevers or chills, there is some tenderness at the left pelvic rim posteriorly suggesting possible musculoskeletal source, however with radiation into his left lower quadrant CT imaging was completed to rule out kidney stone or other intra-abdominal source of symptoms.  Labs are unremarkable, he does not have a UTI.  He does have a significantly enlarged prostate per CT imaging, with suggestion of mild  bladder wall thickening which could imply chronic bladder outlet obstruction.  No tumors, hernia.  Patient denies scrotal pain or swelling.  X-rays show mild arthritis in his bilateral hips and lumbar spine, which could also possibly contribute to his symptoms.  He was encouraged close follow-up with his primary provider, also discussed that he may benefit from urology evaluation of his prostate and he should discuss this with Dr. Legrand Rams at his next visit..  Follow-up anticipated.  Amount and/or Complexity of Data Reviewed Labs: ordered.    Details: Reviewed, negative for UTI Radiology: ordered and independent interpretation performed.    Details: Reviewed and agree with interpretation.  Results mentioned above.  Risk Prescription drug management.           Final Clinical Impression(s) / ED Diagnoses Final diagnoses:  Left hip pain  Benign prostatic hyperplasia with urinary frequency    Rx / DC Orders ED Discharge Orders  Ordered    oxyCODONE-acetaminophen (PERCOCET/ROXICET) 5-325 MG tablet  Every 6 hours PRN        09/17/22 2356              Evalee Jefferson, PA-C 09/19/22 1441    Davonna Belling, MD 09/23/22 1452

## 2022-09-17 NOTE — Discharge Instructions (Signed)
Your CT and labs today do not show the obvious source of your symptoms.  You do not have a urinary tract infection.  However you do have quite enlarged prostate gland which could be the source of your urinary symptoms.  Your x-rays reflect mild arthritis in your hips.  Plan follow-up care with Dr. Henderson Cloud as discussed.  Do not drive within 4 hours of taking the pain medication you were given as this may make you drowsy.

## 2022-09-18 DIAGNOSIS — I10 Essential (primary) hypertension: Secondary | ICD-10-CM | POA: Diagnosis not present

## 2022-09-18 DIAGNOSIS — M479 Spondylosis, unspecified: Secondary | ICD-10-CM | POA: Diagnosis not present

## 2022-09-18 DIAGNOSIS — M899 Disorder of bone, unspecified: Secondary | ICD-10-CM | POA: Diagnosis not present

## 2022-09-19 MED FILL — Oxycodone w/ Acetaminophen Tab 5-325 MG: ORAL | Qty: 6 | Status: AC

## 2022-09-20 LAB — URINE CULTURE: Culture: 20000 — AB

## 2022-09-21 ENCOUNTER — Telehealth (HOSPITAL_BASED_OUTPATIENT_CLINIC_OR_DEPARTMENT_OTHER): Payer: Self-pay | Admitting: *Deleted

## 2022-09-21 NOTE — Progress Notes (Signed)
ED Antimicrobial Stewardship Positive Culture Follow Up   Tracy Davenport is an 81 y.o. male who presented to [facility] on 09/17/2022 with a chief complaint of  Chief Complaint  Patient presents with   Hip Pain    Recent Results (from the past 720 hour(s))  Urine Culture     Status: Abnormal   Collection Time: 09/17/22  9:30 PM   Specimen: Urine, Clean Catch  Result Value Ref Range Status   Specimen Description   Final    URINE, CLEAN CATCH Performed at [facility], [address] [place], [place] [address]    Special Requests   Final    NONE Performed at [facility], [address], [place], [place] [address]    Culture 20,000 COLONIES/mL STAPHYLOCOCCUS EPIDERMIDIS (A)  Final   Report Status 09/20/2022 FINAL  Final   Organism ID, Bacteria STAPHYLOCOCCUS EPIDERMIDIS (A)  Final      Susceptibility   Staphylococcus epidermidis - MIC*    CIPROFLOXACIN <=0.5 SENSITIVE Sensitive     GENTAMICIN <=0.5 SENSITIVE Sensitive     NITROFURANTOIN <=16 SENSITIVE Sensitive     OXACILLIN <=0.25 SENSITIVE Sensitive     TETRACYCLINE <=1 SENSITIVE Sensitive     VANCOMYCIN <=0.5 SENSITIVE Sensitive     TRIMETH/SULFA <=10 SENSITIVE Sensitive     CLINDAMYCIN <=0.25 SENSITIVE Sensitive     RIFAMPIN <=0.5 SENSITIVE Sensitive     Inducible Clindamycin NEGATIVE Sensitive     * 20,000 COLONIES/mL STAPHYLOCOCCUS EPIDERMIDIS    '[x]'$  Patient discharged originally without antimicrobial agent and treatment may be indicated  If symptoms: Start Bcatrim 1 DS PO BID x 7 days  If no symptoms: no antibiotics  ED Provider: Dion Saucier, PA-C    Ventura Sellers 09/21/2022, 1:35 PM Clinical Pharmacist Monday - Friday phone -  [phone] Saturday - Sunday phone - [phone]

## 2022-09-21 NOTE — Telephone Encounter (Signed)
Post ED Visit - Positive Culture Follow-up  Culture report reviewed by antimicrobial stewardship pharmacist: [place] Team '[]'$  Elenor Quinones, Pharm.D. '[]'$  Heide Guile, Pharm.D., BCPS AQ-ID '[]'$  Parks Neptune, Pharm.D., BCPS '[]'$  Alycia Rossetti, Pharm.D., BCPS '[]'$  [place], Pharm.D., BCPS, AAHIVP '[]'$  Legrand Como, Pharm.D., BCPS, AAHIVP '[]'$  Salome Arnt, PharmD, BCPS '[]'$  Johnnette Gourd, PharmD, BCPS '[]'$  Hughes Better, PharmD, BCPS '[]'$  Leeroy Cha, PharmD '[x]'$  Esmeralda Arthur, PharmD,    [place] Team '[]'$  Leodis Sias, PharmD '[]'$  Lindell Spar, PharmD '[]'$  Royetta Asal, PharmD '[]'$  Graylin Shiver, Rph '[]'$  Rema Fendt) Glennon Mac, PharmD '[]'$  Arlyn Dunning, PharmD '[]'$  Netta Cedars, PharmD '[]'$  Dia Sitter, PharmD '[]'$  Leone Haven, PharmD '[]'$  Gretta Arab, PharmD '[]'$  Theodis Shove, PharmD '[]'$  Peggyann Juba, PharmD '[]'$  Reuel Boom, PharmD   Positive urine culture Patient currently not having urinary symptoms and no further patient follow-up is required at this time per Dion Saucier, PA-C  Rosie Fate 09/21/2022, 2:38 PM

## 2022-10-19 DIAGNOSIS — I10 Essential (primary) hypertension: Secondary | ICD-10-CM | POA: Diagnosis not present

## 2022-10-19 DIAGNOSIS — H409 Unspecified glaucoma: Secondary | ICD-10-CM | POA: Diagnosis not present

## 2022-11-19 DIAGNOSIS — H409 Unspecified glaucoma: Secondary | ICD-10-CM | POA: Diagnosis not present

## 2022-11-19 DIAGNOSIS — I10 Essential (primary) hypertension: Secondary | ICD-10-CM | POA: Diagnosis not present

## 2022-12-03 DIAGNOSIS — H35033 Hypertensive retinopathy, bilateral: Secondary | ICD-10-CM | POA: Diagnosis not present

## 2022-12-03 DIAGNOSIS — H26491 Other secondary cataract, right eye: Secondary | ICD-10-CM | POA: Diagnosis not present

## 2022-12-03 DIAGNOSIS — H401123 Primary open-angle glaucoma, left eye, severe stage: Secondary | ICD-10-CM | POA: Diagnosis not present

## 2022-12-03 DIAGNOSIS — H401111 Primary open-angle glaucoma, right eye, mild stage: Secondary | ICD-10-CM | POA: Diagnosis not present

## 2022-12-18 DIAGNOSIS — H409 Unspecified glaucoma: Secondary | ICD-10-CM | POA: Diagnosis not present

## 2022-12-18 DIAGNOSIS — I10 Essential (primary) hypertension: Secondary | ICD-10-CM | POA: Diagnosis not present

## 2023-01-18 DIAGNOSIS — H409 Unspecified glaucoma: Secondary | ICD-10-CM | POA: Diagnosis not present

## 2023-01-18 DIAGNOSIS — I10 Essential (primary) hypertension: Secondary | ICD-10-CM | POA: Diagnosis not present

## 2023-02-17 DIAGNOSIS — I10 Essential (primary) hypertension: Secondary | ICD-10-CM | POA: Diagnosis not present

## 2023-02-17 DIAGNOSIS — H409 Unspecified glaucoma: Secondary | ICD-10-CM | POA: Diagnosis not present

## 2023-03-11 DIAGNOSIS — H409 Unspecified glaucoma: Secondary | ICD-10-CM | POA: Diagnosis not present

## 2023-03-11 DIAGNOSIS — N400 Enlarged prostate without lower urinary tract symptoms: Secondary | ICD-10-CM | POA: Diagnosis not present

## 2023-03-11 DIAGNOSIS — M479 Spondylosis, unspecified: Secondary | ICD-10-CM | POA: Diagnosis not present

## 2023-03-11 DIAGNOSIS — I10 Essential (primary) hypertension: Secondary | ICD-10-CM | POA: Diagnosis not present

## 2023-03-18 DIAGNOSIS — H35033 Hypertensive retinopathy, bilateral: Secondary | ICD-10-CM | POA: Diagnosis not present

## 2023-03-18 DIAGNOSIS — H401111 Primary open-angle glaucoma, right eye, mild stage: Secondary | ICD-10-CM | POA: Diagnosis not present

## 2023-03-18 DIAGNOSIS — H401123 Primary open-angle glaucoma, left eye, severe stage: Secondary | ICD-10-CM | POA: Diagnosis not present

## 2023-03-18 DIAGNOSIS — H26491 Other secondary cataract, right eye: Secondary | ICD-10-CM | POA: Diagnosis not present

## 2023-04-10 DIAGNOSIS — I10 Essential (primary) hypertension: Secondary | ICD-10-CM | POA: Diagnosis not present

## 2023-04-10 DIAGNOSIS — H409 Unspecified glaucoma: Secondary | ICD-10-CM | POA: Diagnosis not present

## 2023-05-11 DIAGNOSIS — H409 Unspecified glaucoma: Secondary | ICD-10-CM | POA: Diagnosis not present

## 2023-05-11 DIAGNOSIS — I10 Essential (primary) hypertension: Secondary | ICD-10-CM | POA: Diagnosis not present

## 2023-05-13 DIAGNOSIS — H401123 Primary open-angle glaucoma, left eye, severe stage: Secondary | ICD-10-CM | POA: Diagnosis not present

## 2023-05-13 DIAGNOSIS — H401111 Primary open-angle glaucoma, right eye, mild stage: Secondary | ICD-10-CM | POA: Diagnosis not present

## 2023-06-11 DIAGNOSIS — I10 Essential (primary) hypertension: Secondary | ICD-10-CM | POA: Diagnosis not present

## 2023-06-11 DIAGNOSIS — H409 Unspecified glaucoma: Secondary | ICD-10-CM | POA: Diagnosis not present

## 2023-07-11 DIAGNOSIS — H409 Unspecified glaucoma: Secondary | ICD-10-CM | POA: Diagnosis not present

## 2023-07-11 DIAGNOSIS — I10 Essential (primary) hypertension: Secondary | ICD-10-CM | POA: Diagnosis not present

## 2023-08-11 DIAGNOSIS — I10 Essential (primary) hypertension: Secondary | ICD-10-CM | POA: Diagnosis not present

## 2023-08-11 DIAGNOSIS — H409 Unspecified glaucoma: Secondary | ICD-10-CM | POA: Diagnosis not present

## 2023-08-20 DIAGNOSIS — H26491 Other secondary cataract, right eye: Secondary | ICD-10-CM | POA: Diagnosis not present

## 2023-08-20 DIAGNOSIS — H401123 Primary open-angle glaucoma, left eye, severe stage: Secondary | ICD-10-CM | POA: Diagnosis not present

## 2023-08-20 DIAGNOSIS — H401111 Primary open-angle glaucoma, right eye, mild stage: Secondary | ICD-10-CM | POA: Diagnosis not present

## 2023-08-20 DIAGNOSIS — H35033 Hypertensive retinopathy, bilateral: Secondary | ICD-10-CM | POA: Diagnosis not present

## 2023-09-09 DIAGNOSIS — I10 Essential (primary) hypertension: Secondary | ICD-10-CM | POA: Diagnosis not present

## 2023-09-09 DIAGNOSIS — N400 Enlarged prostate without lower urinary tract symptoms: Secondary | ICD-10-CM | POA: Diagnosis not present

## 2023-09-09 DIAGNOSIS — Z0001 Encounter for general adult medical examination with abnormal findings: Secondary | ICD-10-CM | POA: Diagnosis not present

## 2023-09-09 DIAGNOSIS — M8588 Other specified disorders of bone density and structure, other site: Secondary | ICD-10-CM | POA: Diagnosis not present

## 2023-09-09 DIAGNOSIS — H409 Unspecified glaucoma: Secondary | ICD-10-CM | POA: Diagnosis not present

## 2023-09-09 DIAGNOSIS — Z23 Encounter for immunization: Secondary | ICD-10-CM | POA: Diagnosis not present

## 2023-09-09 DIAGNOSIS — J449 Chronic obstructive pulmonary disease, unspecified: Secondary | ICD-10-CM | POA: Diagnosis not present

## 2023-09-09 DIAGNOSIS — M479 Spondylosis, unspecified: Secondary | ICD-10-CM | POA: Diagnosis not present

## 2023-10-10 DIAGNOSIS — I10 Essential (primary) hypertension: Secondary | ICD-10-CM | POA: Diagnosis not present

## 2023-10-10 DIAGNOSIS — H409 Unspecified glaucoma: Secondary | ICD-10-CM | POA: Diagnosis not present

## 2023-11-10 DIAGNOSIS — I10 Essential (primary) hypertension: Secondary | ICD-10-CM | POA: Diagnosis not present

## 2023-11-10 DIAGNOSIS — H409 Unspecified glaucoma: Secondary | ICD-10-CM | POA: Diagnosis not present

## 2023-12-03 DIAGNOSIS — H401123 Primary open-angle glaucoma, left eye, severe stage: Secondary | ICD-10-CM | POA: Diagnosis not present

## 2023-12-03 DIAGNOSIS — H524 Presbyopia: Secondary | ICD-10-CM | POA: Diagnosis not present

## 2023-12-03 DIAGNOSIS — H35033 Hypertensive retinopathy, bilateral: Secondary | ICD-10-CM | POA: Diagnosis not present

## 2023-12-03 DIAGNOSIS — H26491 Other secondary cataract, right eye: Secondary | ICD-10-CM | POA: Diagnosis not present

## 2023-12-03 DIAGNOSIS — H401111 Primary open-angle glaucoma, right eye, mild stage: Secondary | ICD-10-CM | POA: Diagnosis not present

## 2023-12-08 DIAGNOSIS — I10 Essential (primary) hypertension: Secondary | ICD-10-CM | POA: Diagnosis not present

## 2023-12-08 DIAGNOSIS — H409 Unspecified glaucoma: Secondary | ICD-10-CM | POA: Diagnosis not present

## 2024-01-08 DIAGNOSIS — I10 Essential (primary) hypertension: Secondary | ICD-10-CM | POA: Diagnosis not present

## 2024-01-08 DIAGNOSIS — H409 Unspecified glaucoma: Secondary | ICD-10-CM | POA: Diagnosis not present

## 2024-02-07 DIAGNOSIS — I10 Essential (primary) hypertension: Secondary | ICD-10-CM | POA: Diagnosis not present

## 2024-02-07 DIAGNOSIS — H409 Unspecified glaucoma: Secondary | ICD-10-CM | POA: Diagnosis not present

## 2024-02-12 DIAGNOSIS — H401111 Primary open-angle glaucoma, right eye, mild stage: Secondary | ICD-10-CM | POA: Diagnosis not present

## 2024-02-12 DIAGNOSIS — H401123 Primary open-angle glaucoma, left eye, severe stage: Secondary | ICD-10-CM | POA: Diagnosis not present

## 2024-02-12 DIAGNOSIS — H26491 Other secondary cataract, right eye: Secondary | ICD-10-CM | POA: Diagnosis not present

## 2024-02-12 DIAGNOSIS — Z961 Presence of intraocular lens: Secondary | ICD-10-CM | POA: Diagnosis not present

## 2024-03-09 DIAGNOSIS — I10 Essential (primary) hypertension: Secondary | ICD-10-CM | POA: Diagnosis not present

## 2024-03-09 DIAGNOSIS — M13861 Other specified arthritis, right knee: Secondary | ICD-10-CM | POA: Diagnosis not present

## 2024-03-09 DIAGNOSIS — H409 Unspecified glaucoma: Secondary | ICD-10-CM | POA: Diagnosis not present

## 2024-03-09 DIAGNOSIS — N400 Enlarged prostate without lower urinary tract symptoms: Secondary | ICD-10-CM | POA: Diagnosis not present

## 2024-03-09 DIAGNOSIS — J449 Chronic obstructive pulmonary disease, unspecified: Secondary | ICD-10-CM | POA: Diagnosis not present

## 2024-04-08 DIAGNOSIS — I10 Essential (primary) hypertension: Secondary | ICD-10-CM | POA: Diagnosis not present

## 2024-04-08 DIAGNOSIS — H409 Unspecified glaucoma: Secondary | ICD-10-CM | POA: Diagnosis not present

## 2024-05-09 DIAGNOSIS — H409 Unspecified glaucoma: Secondary | ICD-10-CM | POA: Diagnosis not present

## 2024-05-09 DIAGNOSIS — I10 Essential (primary) hypertension: Secondary | ICD-10-CM | POA: Diagnosis not present

## 2024-06-09 DIAGNOSIS — I10 Essential (primary) hypertension: Secondary | ICD-10-CM | POA: Diagnosis not present

## 2024-06-09 DIAGNOSIS — H409 Unspecified glaucoma: Secondary | ICD-10-CM | POA: Diagnosis not present

## 2024-07-09 DIAGNOSIS — H409 Unspecified glaucoma: Secondary | ICD-10-CM | POA: Diagnosis not present

## 2024-07-09 DIAGNOSIS — I10 Essential (primary) hypertension: Secondary | ICD-10-CM | POA: Diagnosis not present

## 2024-07-20 DIAGNOSIS — H401123 Primary open-angle glaucoma, left eye, severe stage: Secondary | ICD-10-CM | POA: Diagnosis not present

## 2024-07-20 DIAGNOSIS — H401112 Primary open-angle glaucoma, right eye, moderate stage: Secondary | ICD-10-CM | POA: Diagnosis not present

## 2024-07-20 DIAGNOSIS — H26491 Other secondary cataract, right eye: Secondary | ICD-10-CM | POA: Diagnosis not present

## 2024-07-20 DIAGNOSIS — H35033 Hypertensive retinopathy, bilateral: Secondary | ICD-10-CM | POA: Diagnosis not present

## 2024-08-09 DIAGNOSIS — H409 Unspecified glaucoma: Secondary | ICD-10-CM | POA: Diagnosis not present

## 2024-08-09 DIAGNOSIS — I10 Essential (primary) hypertension: Secondary | ICD-10-CM | POA: Diagnosis not present

## 2024-09-06 DIAGNOSIS — M8588 Other specified disorders of bone density and structure, other site: Secondary | ICD-10-CM | POA: Diagnosis not present

## 2024-09-06 DIAGNOSIS — I10 Essential (primary) hypertension: Secondary | ICD-10-CM | POA: Diagnosis not present

## 2024-09-06 DIAGNOSIS — Z0001 Encounter for general adult medical examination with abnormal findings: Secondary | ICD-10-CM | POA: Diagnosis not present

## 2024-09-06 DIAGNOSIS — Z1331 Encounter for screening for depression: Secondary | ICD-10-CM | POA: Diagnosis not present

## 2024-09-06 DIAGNOSIS — Z1389 Encounter for screening for other disorder: Secondary | ICD-10-CM | POA: Diagnosis not present

## 2024-09-06 DIAGNOSIS — N400 Enlarged prostate without lower urinary tract symptoms: Secondary | ICD-10-CM | POA: Diagnosis not present

## 2024-09-06 DIAGNOSIS — J449 Chronic obstructive pulmonary disease, unspecified: Secondary | ICD-10-CM | POA: Diagnosis not present

## 2024-09-06 DIAGNOSIS — M899 Disorder of bone, unspecified: Secondary | ICD-10-CM | POA: Diagnosis not present

## 2024-09-06 DIAGNOSIS — H409 Unspecified glaucoma: Secondary | ICD-10-CM | POA: Diagnosis not present

## 2024-09-07 DIAGNOSIS — Z79899 Other long term (current) drug therapy: Secondary | ICD-10-CM | POA: Diagnosis not present
